# Patient Record
Sex: Female | Born: 1945
Health system: Southern US, Community
[De-identification: ages and names within clinical notes are randomized; demographics above are authoritative.]

## PROBLEM LIST (undated history)

## (undated) DIAGNOSIS — H269 Unspecified cataract: Secondary | ICD-10-CM

## (undated) DIAGNOSIS — I4891 Unspecified atrial fibrillation: Secondary | ICD-10-CM

## (undated) DIAGNOSIS — I10 Essential (primary) hypertension: Secondary | ICD-10-CM

## (undated) DIAGNOSIS — C50919 Malignant neoplasm of unspecified site of unspecified female breast: Secondary | ICD-10-CM

## (undated) DIAGNOSIS — E079 Disorder of thyroid, unspecified: Secondary | ICD-10-CM

## (undated) DIAGNOSIS — E039 Hypothyroidism, unspecified: Secondary | ICD-10-CM

## (undated) DIAGNOSIS — I872 Venous insufficiency (chronic) (peripheral): Secondary | ICD-10-CM

## (undated) HISTORY — DX: Unspecified atrial fibrillation: I48.91

## (undated) HISTORY — DX: Unspecified cataract: H26.9

## (undated) HISTORY — DX: Venous insufficiency (chronic) (peripheral): I87.2

## (undated) HISTORY — DX: Hypothyroidism, unspecified: E03.9

## (undated) HISTORY — PX: TUBAL LIGATION: SHX77

## (undated) HISTORY — DX: Disorder of thyroid, unspecified: E07.9

## (undated) HISTORY — DX: Malignant neoplasm of unspecified site of unspecified female breast: C50.919

## (undated) HISTORY — DX: Essential (primary) hypertension: I10

## (undated) SURGERY — EXCISION LIPOMA
Anesthesia: General | Laterality: Left

---
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
## 1983-06-12 HISTORY — PX: RECONSTRUCTION OF NOSE: SHX2301

## 1998-02-11 ENCOUNTER — Other Ambulatory Visit: Admission: RE | Admit: 1998-02-11 | Discharge: 1998-02-11 | Payer: Self-pay | Admitting: Obstetrics and Gynecology

## 1998-08-10 ENCOUNTER — Other Ambulatory Visit: Admission: RE | Admit: 1998-08-10 | Discharge: 1998-08-10 | Payer: Self-pay | Admitting: Obstetrics and Gynecology

## 1998-08-11 ENCOUNTER — Other Ambulatory Visit: Admission: RE | Admit: 1998-08-11 | Discharge: 1998-08-11 | Payer: Self-pay | Admitting: Obstetrics and Gynecology

## 1999-05-25 ENCOUNTER — Other Ambulatory Visit: Admission: RE | Admit: 1999-05-25 | Discharge: 1999-05-25 | Payer: Self-pay | Admitting: Obstetrics and Gynecology

## 1999-05-25 ENCOUNTER — Encounter (INDEPENDENT_AMBULATORY_CARE_PROVIDER_SITE_OTHER): Payer: Self-pay

## 1999-08-23 ENCOUNTER — Other Ambulatory Visit: Admission: RE | Admit: 1999-08-23 | Discharge: 1999-08-23 | Payer: Self-pay | Admitting: Obstetrics and Gynecology

## 2000-08-26 ENCOUNTER — Other Ambulatory Visit: Admission: RE | Admit: 2000-08-26 | Discharge: 2000-08-26 | Payer: Self-pay | Admitting: Obstetrics and Gynecology

## 2001-06-11 DIAGNOSIS — C50919 Malignant neoplasm of unspecified site of unspecified female breast: Secondary | ICD-10-CM

## 2001-06-11 HISTORY — DX: Malignant neoplasm of unspecified site of unspecified female breast: C50.919

## 2001-08-27 ENCOUNTER — Other Ambulatory Visit: Admission: RE | Admit: 2001-08-27 | Discharge: 2001-08-27 | Payer: Self-pay | Admitting: Obstetrics and Gynecology

## 2002-05-18 ENCOUNTER — Other Ambulatory Visit: Admission: RE | Admit: 2002-05-18 | Discharge: 2002-05-18 | Payer: Self-pay | Admitting: Radiology

## 2002-05-27 ENCOUNTER — Encounter: Payer: Self-pay | Admitting: Surgery

## 2002-05-27 ENCOUNTER — Encounter: Admission: RE | Admit: 2002-05-27 | Discharge: 2002-05-27 | Payer: Self-pay | Admitting: Surgery

## 2002-05-28 ENCOUNTER — Encounter (INDEPENDENT_AMBULATORY_CARE_PROVIDER_SITE_OTHER): Payer: Self-pay | Admitting: *Deleted

## 2002-05-28 ENCOUNTER — Encounter: Payer: Self-pay | Admitting: Surgery

## 2002-05-28 ENCOUNTER — Ambulatory Visit (HOSPITAL_BASED_OUTPATIENT_CLINIC_OR_DEPARTMENT_OTHER): Admission: RE | Admit: 2002-05-28 | Discharge: 2002-05-28 | Payer: Self-pay | Admitting: Surgery

## 2002-06-02 ENCOUNTER — Ambulatory Visit: Admission: RE | Admit: 2002-06-02 | Discharge: 2002-06-22 | Payer: Self-pay | Admitting: Radiation Oncology

## 2002-06-11 HISTORY — PX: OTHER SURGICAL HISTORY: SHX169

## 2002-09-08 ENCOUNTER — Other Ambulatory Visit: Admission: RE | Admit: 2002-09-08 | Discharge: 2002-09-08 | Payer: Self-pay | Admitting: Obstetrics and Gynecology

## 2003-09-13 ENCOUNTER — Other Ambulatory Visit: Admission: RE | Admit: 2003-09-13 | Discharge: 2003-09-13 | Payer: Self-pay | Admitting: Obstetrics and Gynecology

## 2004-04-11 ENCOUNTER — Ambulatory Visit: Payer: Self-pay

## 2004-04-17 ENCOUNTER — Ambulatory Visit: Payer: Self-pay | Admitting: Oncology

## 2004-06-21 ENCOUNTER — Ambulatory Visit: Payer: Self-pay | Admitting: Internal Medicine

## 2004-10-13 ENCOUNTER — Ambulatory Visit: Payer: Self-pay

## 2004-10-16 ENCOUNTER — Ambulatory Visit: Payer: Self-pay | Admitting: Oncology

## 2005-03-21 ENCOUNTER — Other Ambulatory Visit: Admission: RE | Admit: 2005-03-21 | Discharge: 2005-03-21 | Payer: Self-pay | Admitting: Obstetrics and Gynecology

## 2005-04-23 ENCOUNTER — Ambulatory Visit: Payer: Self-pay

## 2005-04-24 ENCOUNTER — Ambulatory Visit: Payer: Self-pay | Admitting: Oncology

## 2005-10-25 ENCOUNTER — Ambulatory Visit: Payer: Self-pay | Admitting: Oncology

## 2005-10-25 ENCOUNTER — Ambulatory Visit: Payer: Self-pay

## 2006-01-04 ENCOUNTER — Ambulatory Visit: Payer: Self-pay | Admitting: Internal Medicine

## 2006-03-26 ENCOUNTER — Other Ambulatory Visit: Admission: RE | Admit: 2006-03-26 | Discharge: 2006-03-26 | Payer: Self-pay | Admitting: Obstetrics and Gynecology

## 2006-04-25 ENCOUNTER — Ambulatory Visit: Payer: Self-pay

## 2006-04-25 ENCOUNTER — Ambulatory Visit: Payer: Self-pay | Admitting: Oncology

## 2006-10-25 ENCOUNTER — Ambulatory Visit: Payer: Self-pay

## 2006-10-29 ENCOUNTER — Ambulatory Visit: Payer: Self-pay | Admitting: Oncology

## 2007-10-16 ENCOUNTER — Ambulatory Visit: Payer: Self-pay

## 2007-10-28 ENCOUNTER — Ambulatory Visit: Payer: Self-pay | Admitting: Oncology

## 2007-11-06 ENCOUNTER — Inpatient Hospital Stay (HOSPITAL_COMMUNITY): Admission: AD | Admit: 2007-11-06 | Discharge: 2007-11-07 | Payer: Self-pay | Admitting: Cardiology

## 2007-11-06 ENCOUNTER — Ambulatory Visit: Payer: Self-pay | Admitting: Cardiology

## 2007-11-07 ENCOUNTER — Encounter: Payer: Self-pay | Admitting: Cardiology

## 2007-11-18 ENCOUNTER — Ambulatory Visit: Payer: Self-pay | Admitting: Cardiology

## 2008-01-06 ENCOUNTER — Ambulatory Visit: Payer: Self-pay | Admitting: Cardiology

## 2008-03-13 IMAGING — CR RIGHT FOOT COMPLETE - 3+ VIEW
1 series · 3 of 3 positions shown · non-contrast
Comparison: none

REASON FOR EXAM: pain rt foot   call report   [phone]   [phone]
COMMENTS:

PROCEDURE:     DXR - DXR FOOT RT COMPLETE W/OBLIQUES  - January 04, 2006  [DATE]
RESULT:          Three views reveals no fractures, no dislocations.  The
joint spaces are intact.

[Series 1: view not recorded · 0.17mm/px · 3 of 3 slices shown]
[im 1/3]
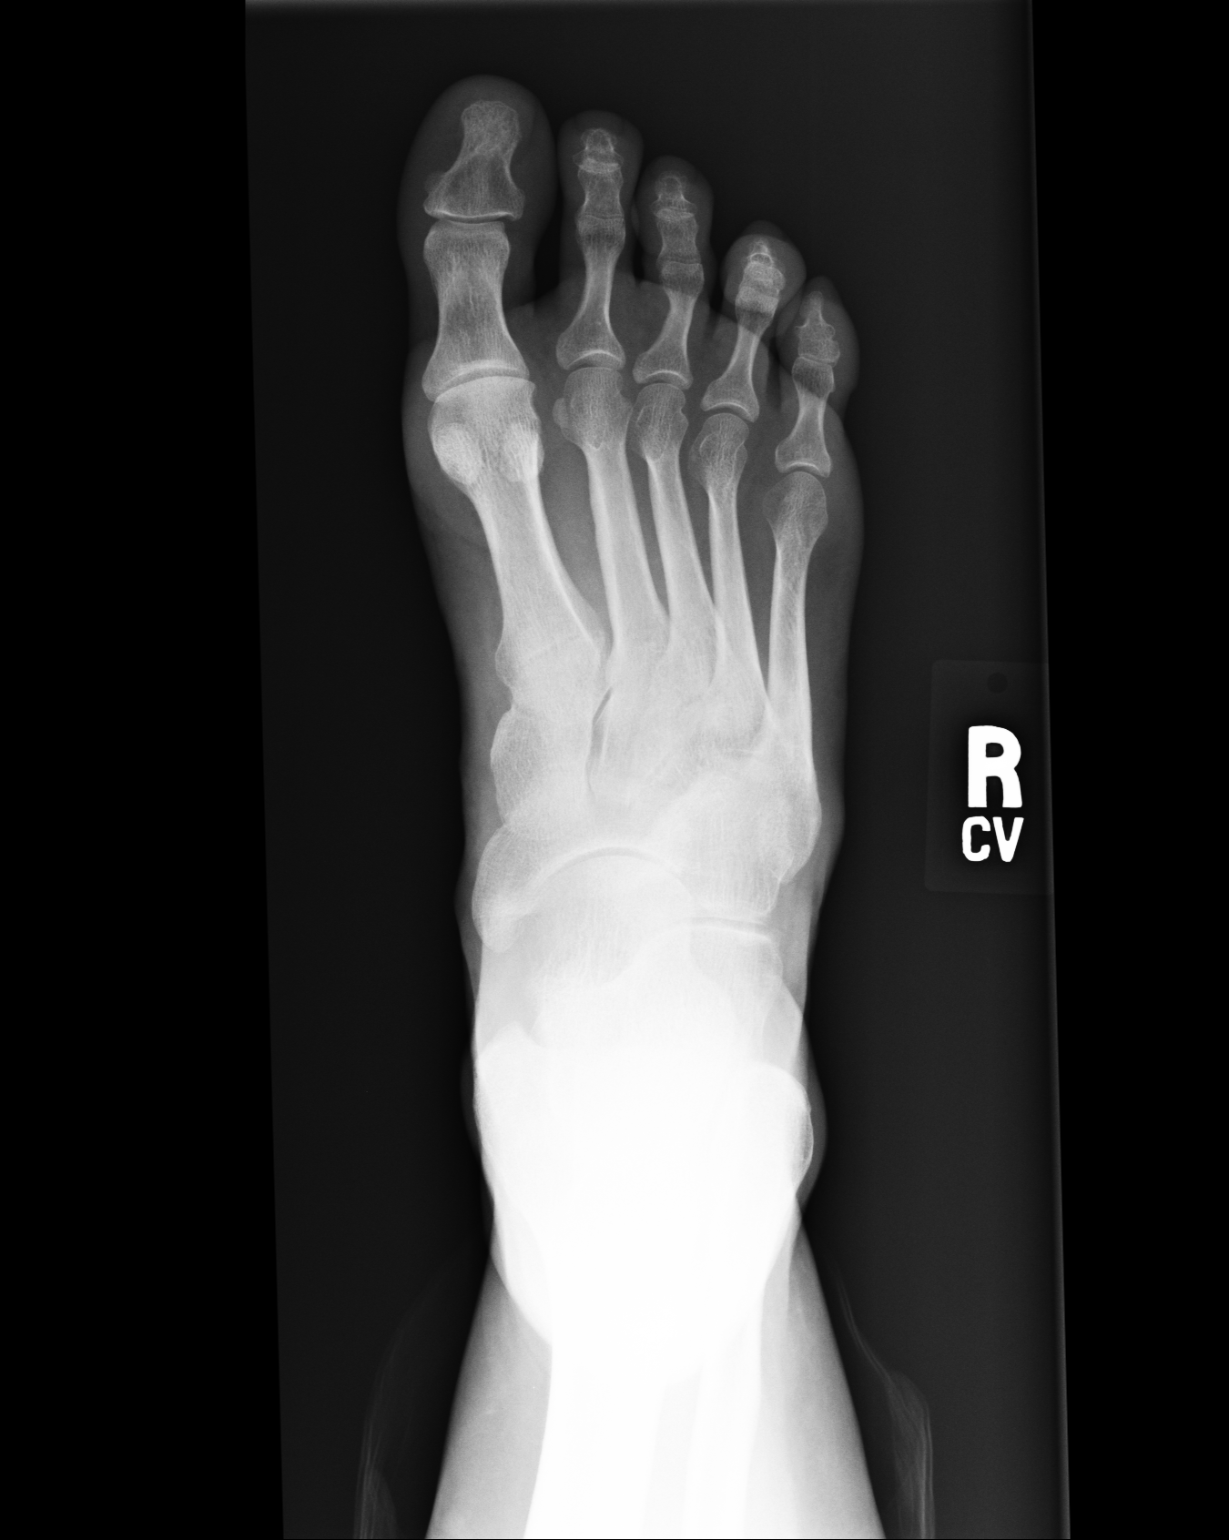
[im 2/3]
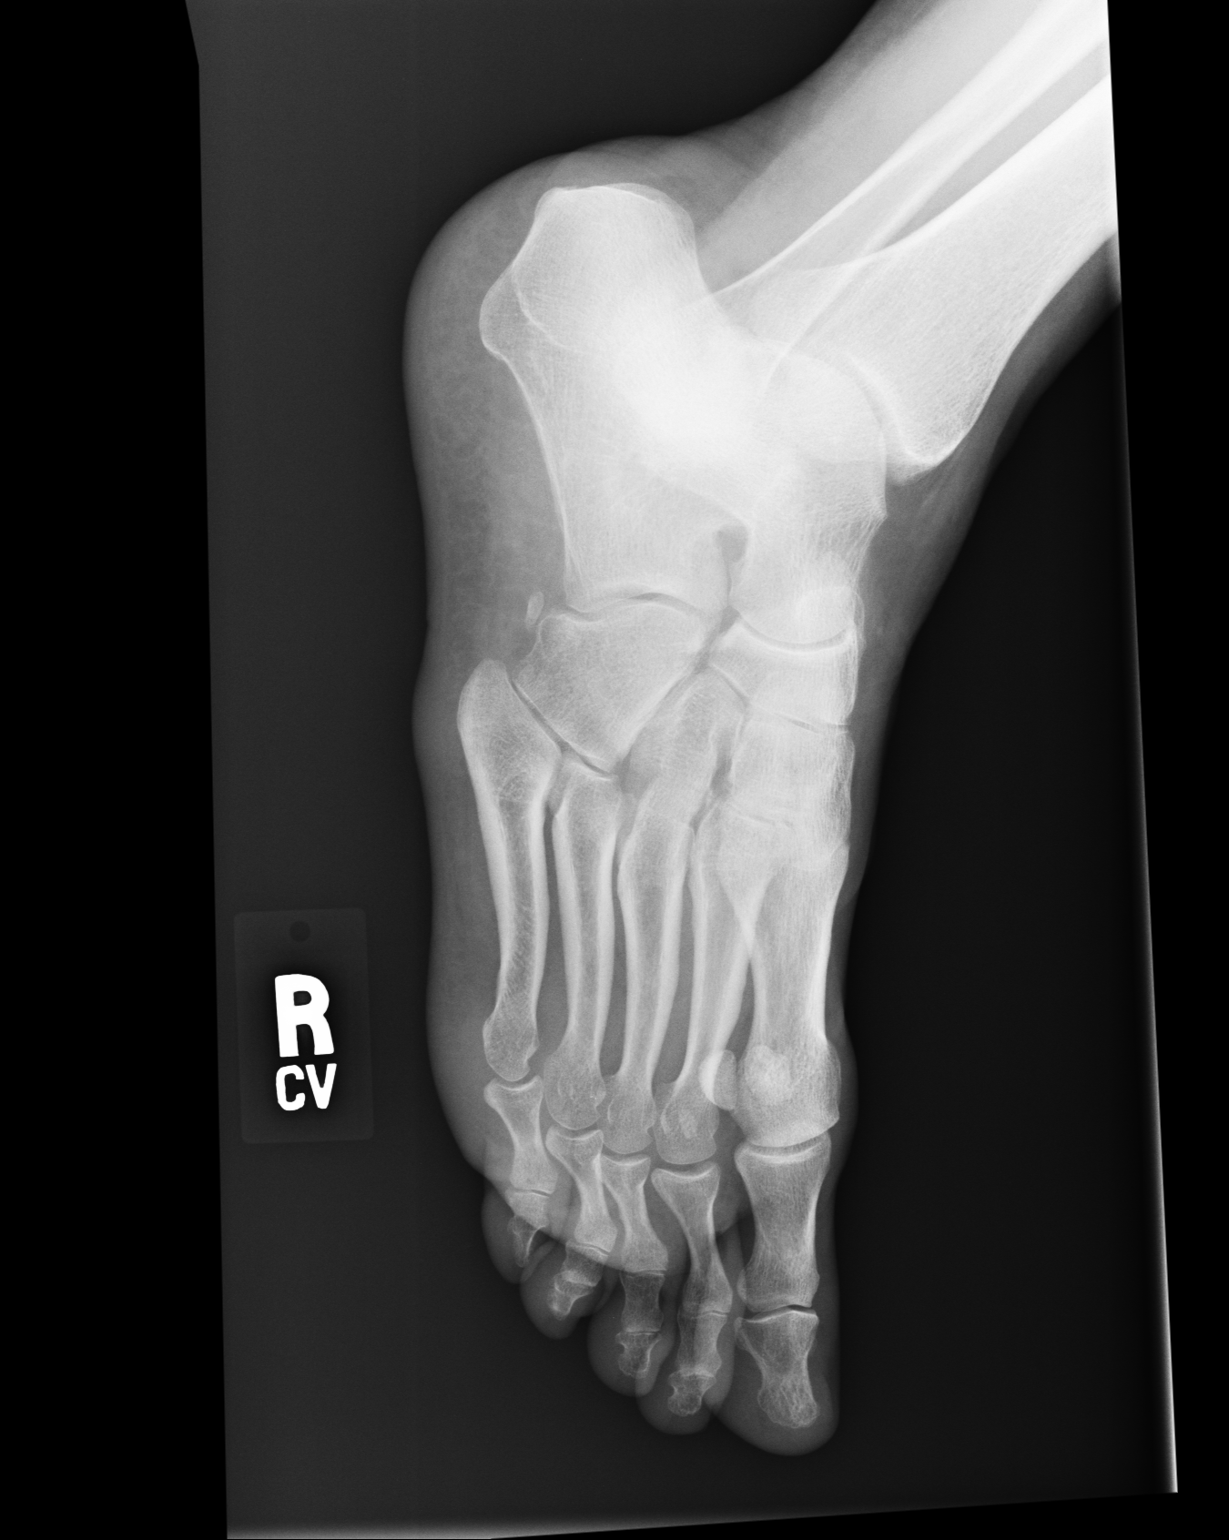
[im 3/3]
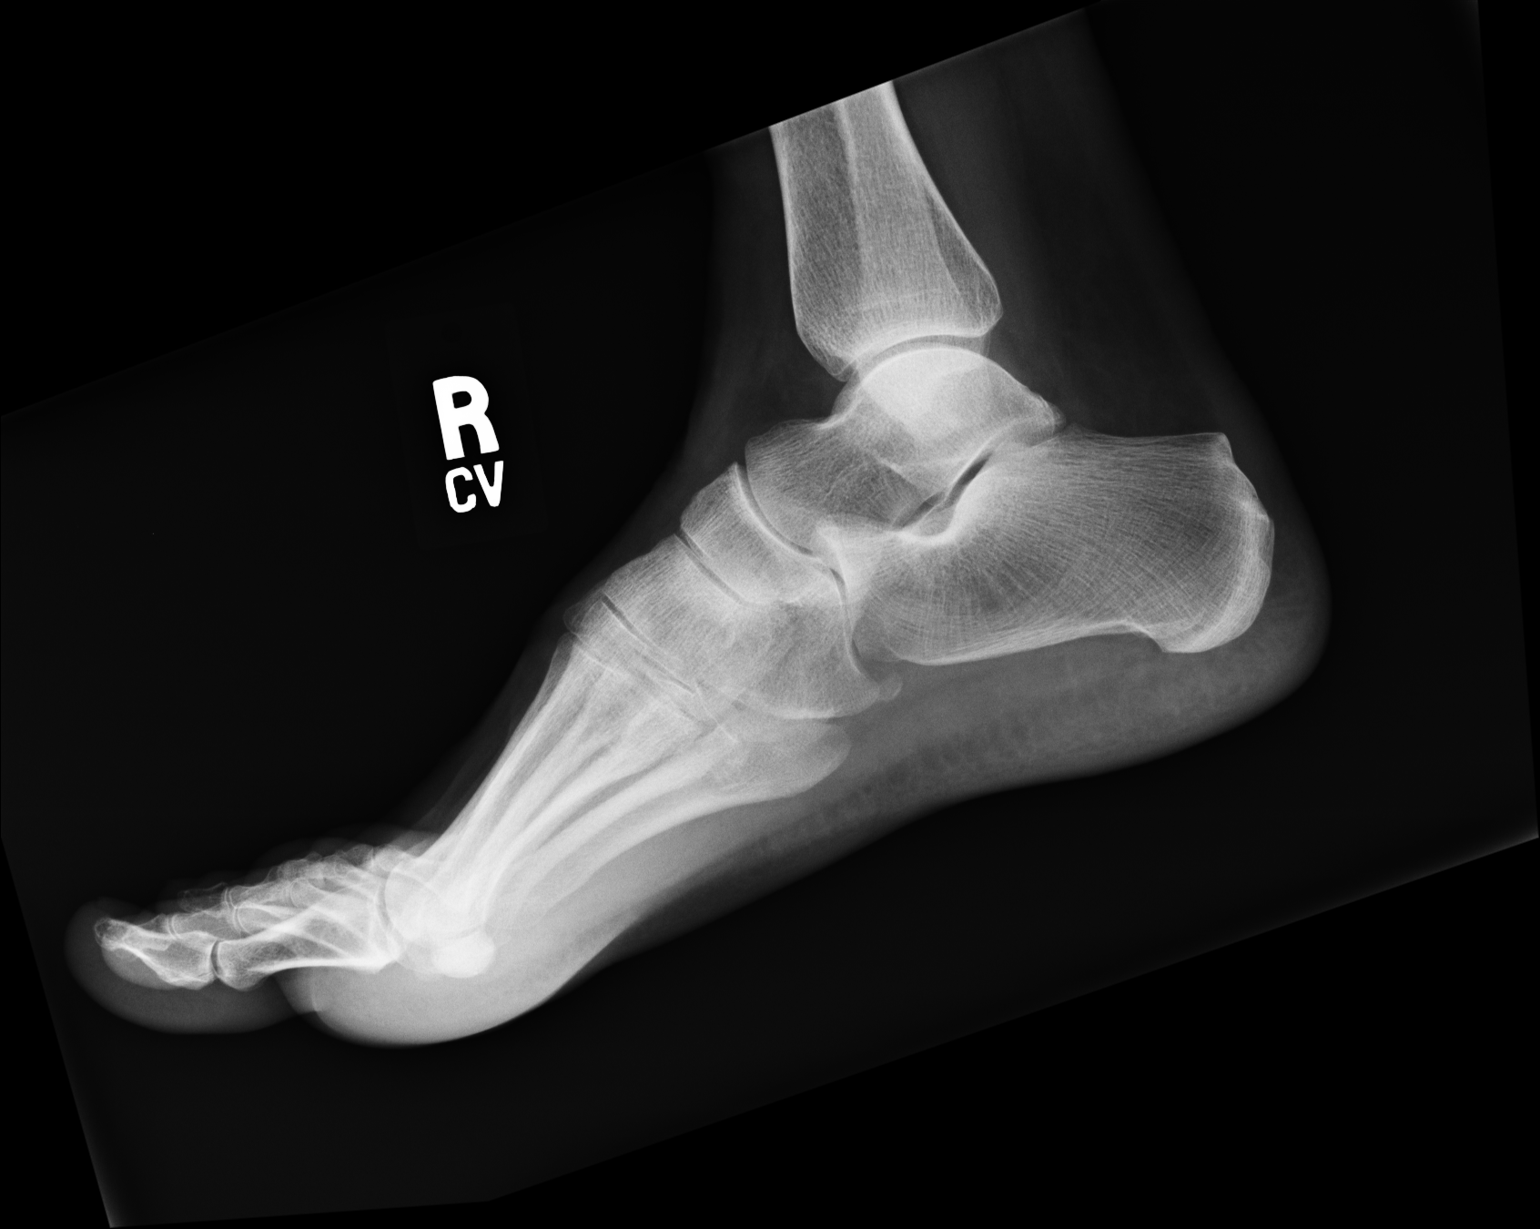

[3 of 3 positions shown; findings below may reference images not displayed]

IMPRESSION: No acute fracture is identified of the RIGHT foot.

## 2008-05-13 ENCOUNTER — Ambulatory Visit: Payer: Self-pay | Admitting: Cardiology

## 2008-05-27 ENCOUNTER — Encounter: Payer: Self-pay | Admitting: Cardiology

## 2008-05-27 ENCOUNTER — Ambulatory Visit: Payer: Self-pay

## 2008-05-27 ENCOUNTER — Ambulatory Visit: Payer: Self-pay | Admitting: Cardiology

## 2008-07-01 ENCOUNTER — Ambulatory Visit: Payer: Self-pay | Admitting: Cardiology

## 2008-08-09 ENCOUNTER — Ambulatory Visit (HOSPITAL_COMMUNITY): Admission: RE | Admit: 2008-08-09 | Discharge: 2008-08-09 | Payer: Self-pay | Admitting: Obstetrics and Gynecology

## 2008-08-09 ENCOUNTER — Encounter (INDEPENDENT_AMBULATORY_CARE_PROVIDER_SITE_OTHER): Payer: Self-pay | Admitting: Obstetrics and Gynecology

## 2008-08-20 ENCOUNTER — Emergency Department (HOSPITAL_COMMUNITY): Admission: EM | Admit: 2008-08-20 | Discharge: 2008-08-20 | Payer: Self-pay | Admitting: Emergency Medicine

## 2008-10-29 ENCOUNTER — Ambulatory Visit: Payer: Self-pay | Admitting: Oncology

## 2008-11-02 ENCOUNTER — Encounter: Payer: Self-pay | Admitting: Cardiology

## 2009-01-05 ENCOUNTER — Ambulatory Visit: Payer: Self-pay | Admitting: Gastroenterology

## 2009-02-11 IMAGING — US US-BREAST([ID])
1 series · 13 of 15 positions shown · non-contrast
Comparison: NONE

CLINICAL DATA: Prior right breast cancer and lumpectomy. 

BILATERAL BREAST ULTRASOUND

[Series 1: us breast · 0.06mm/px · 13 of 15 slices shown]
[im 1/15]
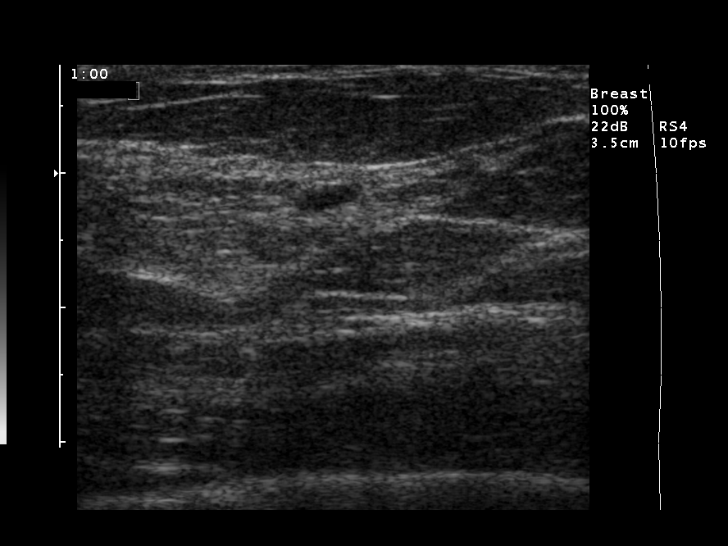
[im 2/15]
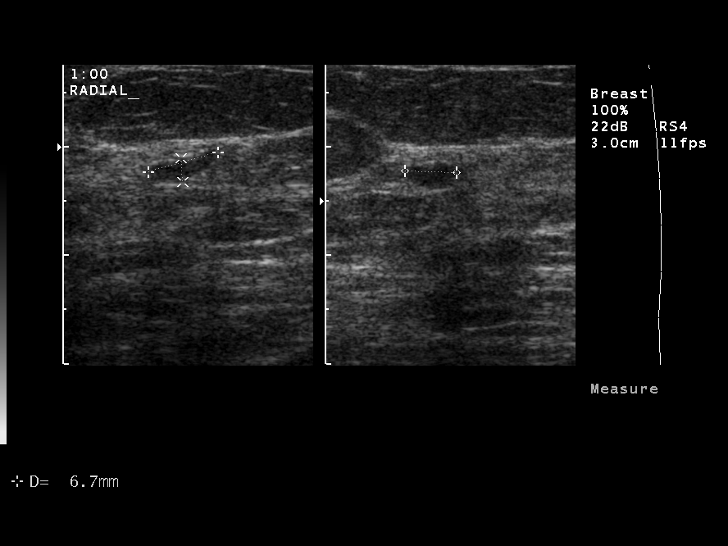
[im 3/15]
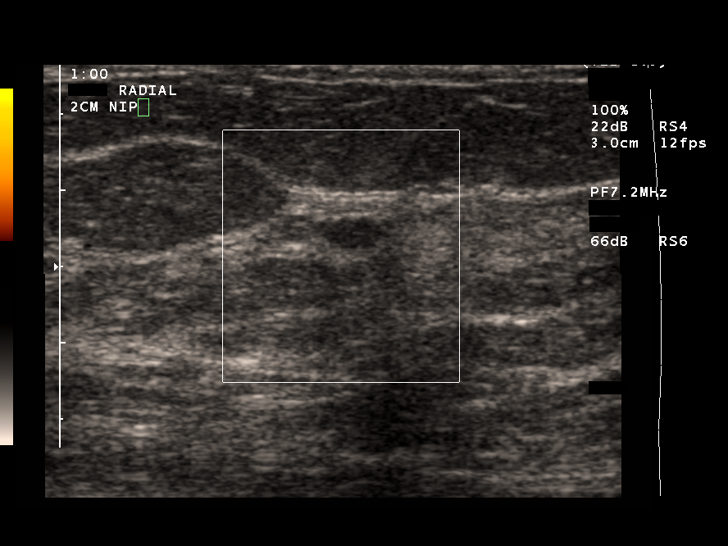
[im 5/15]
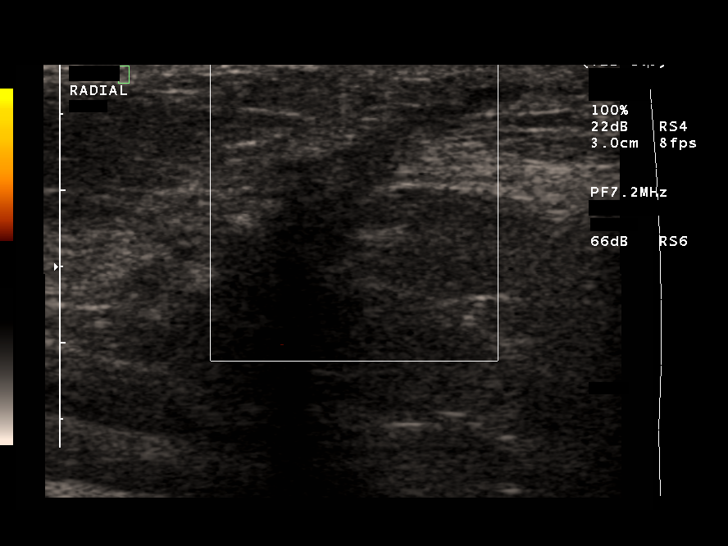
[im 6/15]
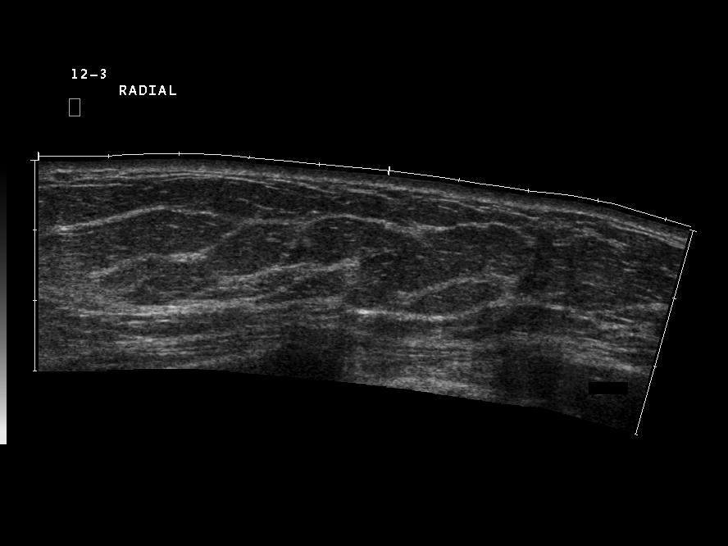
[im 7/15]
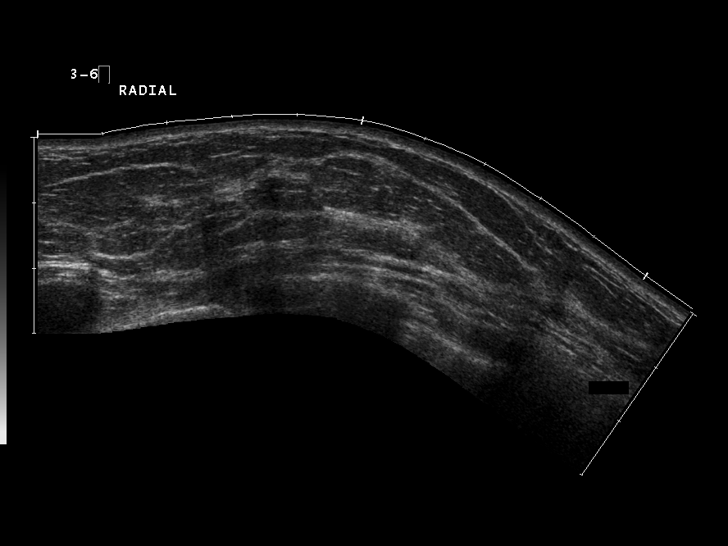
[im 8/15]
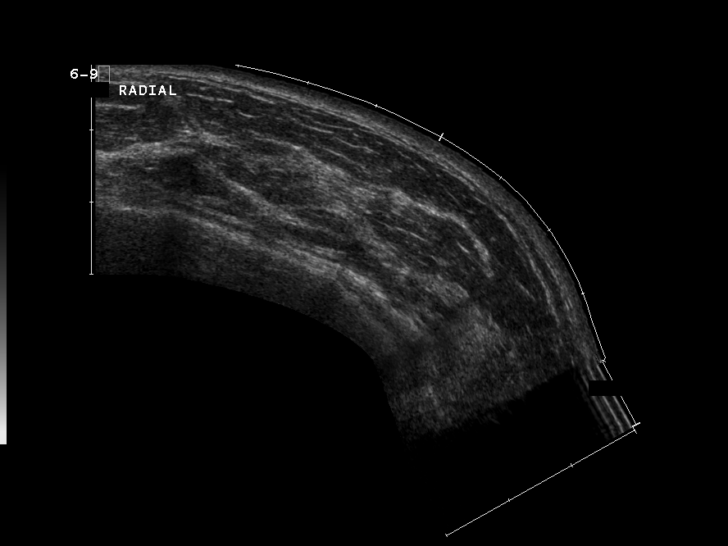
[im 9/15]
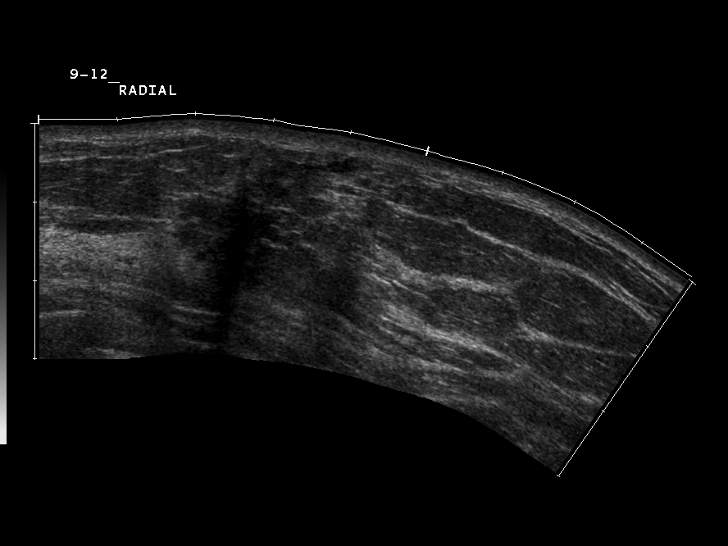
[im 10/15]
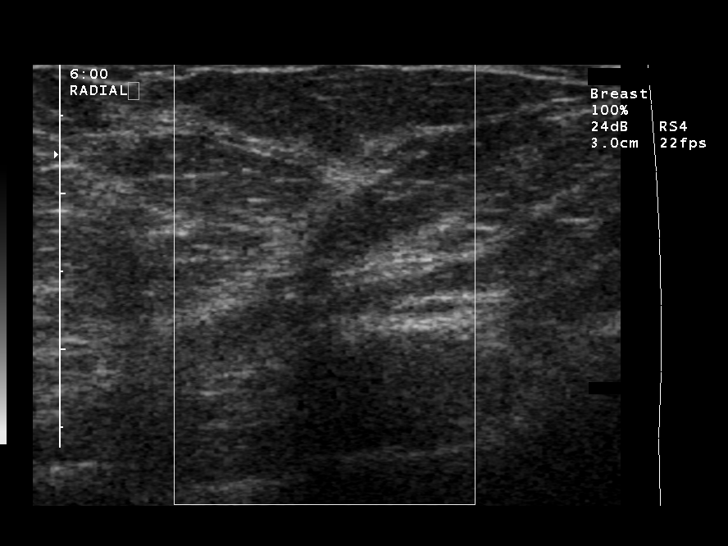
[im 11/15]
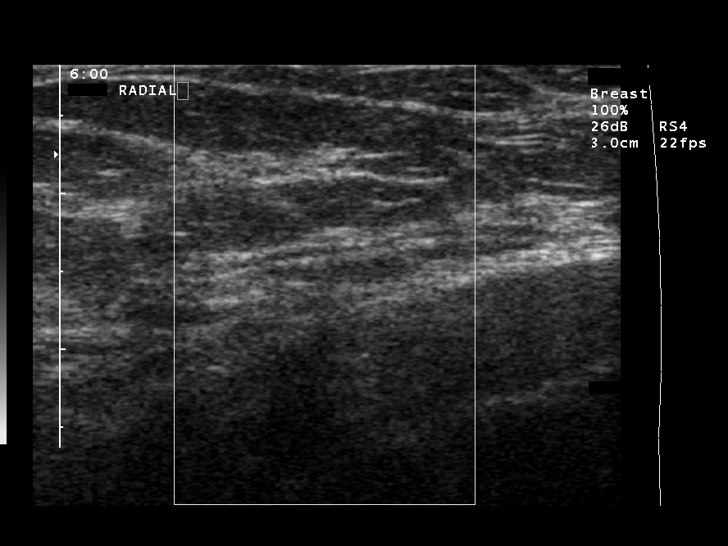
[im 13/15]
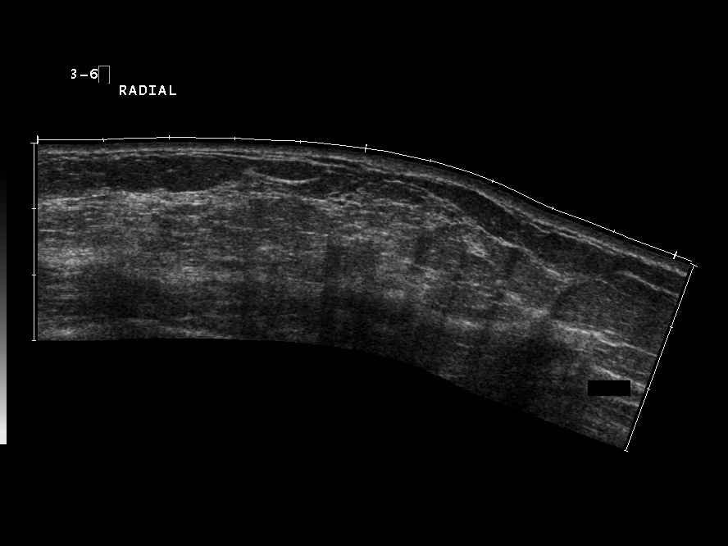
[im 14/15]
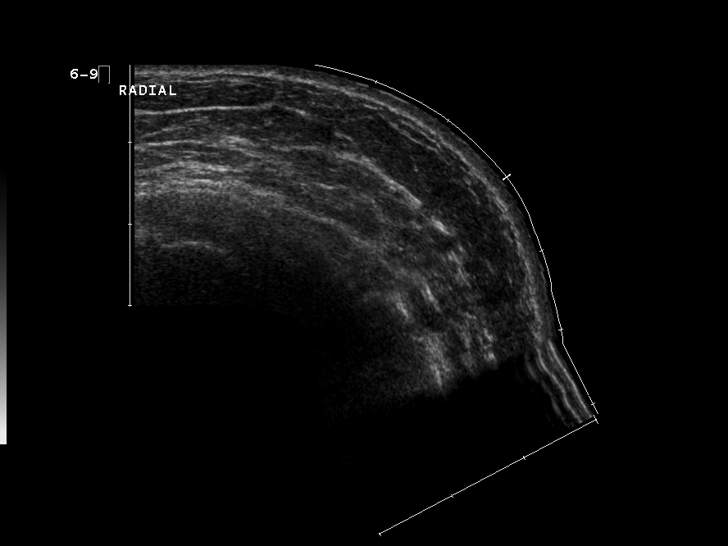
[im 15/15]
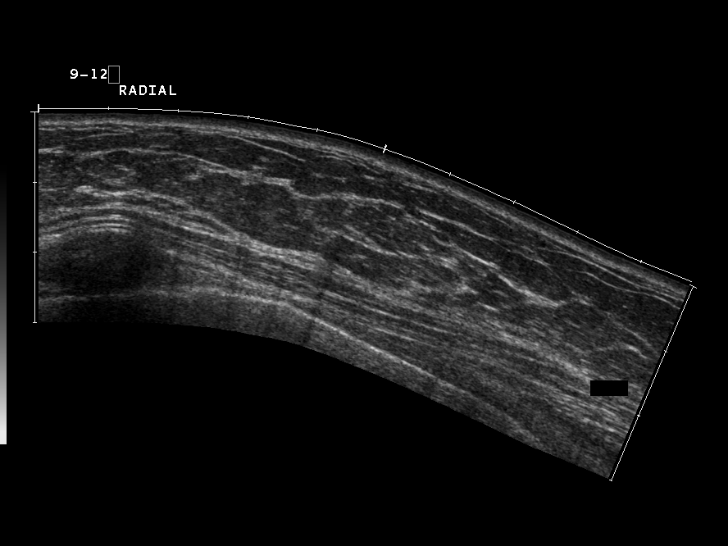

[13 of 15 positions shown; findings below may reference images not displayed]

FINDINGS: Ultrasound was performed of both breasts. The patient 
has a history of previous right breast cancer and lumpectomy. 
There again is acoustic shadowing at the scar site in the right 
breast at the 11 o???clock position. This does not appear to be 
significantly changed compared to the prior ultrasounds of 
01/18/2004 and 12/01/2004. There is a small, well-circumscribed, 
oval-shaped hypoechoic lesion seen at the 1 o???clock position of 
the right breast that measures 7 x 2 x 5 mm. This is not 
significantly changed compared to the ultrasound of 2777. 
Ultrasound of the left breast did not demonstrate the previously 
seen hypoechoic lesion.
IMPRESSION: Acoustic shadowing is noted at the lumpectomy site of 
the right breast which does not appear to be significantly changed 
and likely is due to scar tissue. There is also an unchanged, 
well-circumscribed hypoechoic lesion in the right breast. No 
ultrasonographic evidence of malignancy is seen involving either 
breast. Advised the patient to have a follow-up yearly mammogram 
and informed the patient that mammography compliments the 
ultrasound study. The patient prefers to have yearly follow-up 
ultrasound of both breasts. BI-RADS 2- Benign Vr, M[place] Date: 12/06/2006 DAS  [REDACTED]

## 2009-06-11 DIAGNOSIS — I4891 Unspecified atrial fibrillation: Secondary | ICD-10-CM

## 2009-06-11 HISTORY — DX: Unspecified atrial fibrillation: I48.91

## 2009-11-02 ENCOUNTER — Ambulatory Visit: Payer: Self-pay | Admitting: Oncology

## 2009-12-09 ENCOUNTER — Encounter: Payer: Self-pay | Admitting: Internal Medicine

## 2010-07-18 ENCOUNTER — Encounter: Payer: Self-pay | Admitting: Internal Medicine

## 2010-07-18 ENCOUNTER — Ambulatory Visit (INDEPENDENT_AMBULATORY_CARE_PROVIDER_SITE_OTHER): Payer: BC Managed Care – PPO | Admitting: Internal Medicine

## 2010-07-18 DIAGNOSIS — R030 Elevated blood-pressure reading, without diagnosis of hypertension: Secondary | ICD-10-CM | POA: Insufficient documentation

## 2010-07-18 DIAGNOSIS — I4891 Unspecified atrial fibrillation: Secondary | ICD-10-CM

## 2010-07-18 DIAGNOSIS — E0590 Thyrotoxicosis, unspecified without thyrotoxic crisis or storm: Secondary | ICD-10-CM | POA: Insufficient documentation

## 2010-07-21 ENCOUNTER — Encounter: Payer: Self-pay | Admitting: Internal Medicine

## 2010-07-27 NOTE — Assessment & Plan Note (Signed)
Summary: est. patient/pressure problems/sab  Medications Added * PROGESTERONE unknown dosage * ARMOR THYROID unknown dosage DILTIAZEM HCL ER BEADS 240 MG XR24H-CAP (DILTIAZEM HCL ER BEADS) 1 tablet oncedaily      Allergies Added: ! SULFA ! PENICILLIN  Visit Type:  Initial Consult Primary Provider:  Rueben Bash  CC:  establish care. c/o heart irregularity. Denies chest pain and SOB. Marland Kitchen  History of Present Illness: [name] is a lady who pre.sents for further evaluation of irregular heartbeat.  her cardiac history dates back a couple of years when she was found to be in atrial fibrillation. She apparently converted spontaneously. An echo cardiogram done at that time by Dr. Jens Som demonstrated normal left ventricular function and normal left atrial size.  It was the patient's impression that this was caused by a pharmacist error related to changing her "natural hormones and thyroid to "pharmacological". She ended up being hyperthyroid as she recalls. She was also found at that time to be hypertensive. Over the ensuing couple of years she has stopped taking the diltiazem. Her blood pressure has normalized and she brings her in greater for a year and a half of blood pressures that are mostly normal.  She also underwent "abdominal massage" to deal with what she thought was a hiatal hernia responsible for her palpitations While taking her blood pressure she found about 3 months ago that her heart rhythm was irregular. It has been intermittently identified as irregular. The patient has scant symptoms that she can attribute to the irregularity in terms of exercise performance, lightheadedness or fatigue. Only rarely with extreme exercise i.e. walking up hills that she never noticed any shortness of breath at all.  Thromboembolic risk factors are notable for gender and borderline age.  Notably however her last blood work which we reviewed today from July has a significantly suppressed TSH  suggesting that she may be hyperthyroid. This low TSH level apparently did not result in a change in her thyroid replacement dosing.  the patient is very attuned to her body and uses mostly only natural supplements of which she takes a great many  Preventive Screening-Counseling & Management  Alcohol-Tobacco     Smoking Status: never  Caffeine-Diet-Exercise     Does Patient Exercise: yes      Drug Use:  no.    Current Medications (verified): 1)  Progesterone .... Unknown Dosage 2)  Armor Thyroid .... Unknown Dosage 3)  Diltiazem Hcl Er Beads 240 Mg Xr24h-Cap (Diltiazem Hcl Er Beads) .Marland Kitchen.. 1 Tablet Oncedaily  Allergies (verified): 1)  ! Sulfa 2)  ! Penicillin  Past History:  Family History: Last updated: 07-27-2010 Father: MI- deceased Mother: Stroke-deceased  Social History: Last updated: 2010/07/27 Full Time Single  Tobacco Use - No.  Alcohol Use - yes-1-2 glasses daily Regular Exercise - yes Drug Use - no  Risk Factors: Exercise: yes (2010-07-27)  Risk Factors: Smoking Status: never (07/27/10)  Past Medical History: Hypertension breast cancer status post lumpectomy and radiation therapy Thyroid problems hiatal hernia Atrial fibrillation-2011 Irregular heart rate-2011  Past Surgical History: Breast tumor-2004 Tonsillectomy-1953 Nose reconstruction-1985 tubal ligation  Family History: Father: MI- deceased Mother: Stroke-deceased  Social History: Full Time Single  Tobacco Use - No.  Alcohol Use - yes-1-2 glasses daily Regular Exercise - yes Drug Use - no Smoking Status:  never Does Patient Exercise:  yes Drug Use:  no  Review of Systems       full review of systems was negative apart from a history of present  illness and past medical history tinnitus and fatigue   Vital Signs:  Patient profile:   65 year old female Height:      69 inches Weight:      155.50 pounds BMI:     23.05 Pulse rate:   105 / minute BP sitting:   150 / 95   (left arm) Cuff size:   regular  Vitals Entered By: Lysbeth Galas CMA (July 18, 2010 10:45 AM)  Physical Exam  General:  Well developed, well nourished old Caucasian female appearing her stated age, in no acute distress. Head:  normal HEENT Neck:  supple without thyromegaly neck veins are 6 cm carotid brisk and full without bruits Chest Wall:  without kyphosis Lungs:  clear to auscultation Heart:  irregular rate and rhythm without significant murmurs or gallops PMI was not displaced Abdomen:  soft with active bowel sounds with a primary midline pulsation of the patient was quite thin no hepatomegaly Msk:  without obvious skeletal defects Pulses:  intact distal pulses Extremities:  without clubbing cyanosis or edema Neurologic:  alert and oriented and grossly normal motor and sensory function Skin:  warm and dry Cervical Nodes:  without adenopathy Psych:  engaging affect   EKG  Procedure date:  07/18/2010  Findings:      atrial fibrillation at 105inferolateral ST-T changes  Impression & Recommendations:  Problem # 1:  ATRIAL FIBRILLATION (ICD-427.31) we had a lengthy discussion regarding the physiology and the concerns of atrial fibrillation specifically related to thromboembolism. We discussed her CHADS VASC score as well as the potential implications of hyperthyroidism associated with increased risk of thromboembolism. Her heart rate is somewhat fast today. Her last TSH suggests that she is hyperthyroid. She will get these labs reviewed with the goal of becoming euthyroid. At that time we will undertake that recording to try to understand with her atrial fibrillation is paroxysmal versus persistent. In the event that is the latter, we would undertake cardioversion associated with short-term anticoagulation. We discussed at great length today the side effects and the options to Coumadin including Pradaxa andXeralto.  For right now we will hold off on anticoagulation.Based on the  recent [place] study I am not sure that aspirin is appropriate as an alternative  following the establishment of euthyroidism, will also undertake an echo. Her last echo 2 years ago was normal with normal left atrial size.  We'll also at that point need to assure adequate rate control.  Problem # 2:  HYPERTHYROIDISM (ICD-242.90) as above  Problem # 3:  INCREASED BLOOD PRESSURE (ICD-796.2) her home blood pressures are not consistent with a diagnosis of hypertension and so her CHADS VASC score currently remains one  Other Orders: EKG w/ Interpretation (93000)

## 2010-07-27 NOTE — Letter (Addendum)
Summary: At Home BP Readings  At Home BP Readings   Imported By: Harlon Flor 07/21/2010 16:20:33  _____________________________________________________________________  External Attachment:    Type:   Image     Comment:   External Document  Appended Document: At Home BP Readings Preliminarily reviewed. Forwarded to MD desktop for review and signature /MES

## 2010-08-24 ENCOUNTER — Other Ambulatory Visit: Payer: Self-pay | Admitting: Nurse Practitioner

## 2010-08-24 DIAGNOSIS — E039 Hypothyroidism, unspecified: Secondary | ICD-10-CM

## 2010-08-24 DIAGNOSIS — E063 Autoimmune thyroiditis: Secondary | ICD-10-CM

## 2010-08-31 ENCOUNTER — Other Ambulatory Visit: Payer: BC Managed Care – PPO

## 2010-09-01 ENCOUNTER — Other Ambulatory Visit: Payer: BC Managed Care – PPO

## 2010-09-21 LAB — URINALYSIS, ROUTINE W REFLEX MICROSCOPIC
Bilirubin Urine: NEGATIVE
Glucose, UA: NEGATIVE mg/dL
Ketones, ur: NEGATIVE mg/dL
Nitrite: NEGATIVE
Specific Gravity, Urine: 1.007 (ref 1.005–1.030)
pH: 7 (ref 5.0–8.0)

## 2010-09-21 LAB — CBC
MCHC: 35.1 g/dL (ref 30.0–36.0)
MCV: 95.6 fL (ref 78.0–100.0)
Platelets: 312 10*3/uL (ref 150–400)
RBC: 4.2 MIL/uL (ref 3.87–5.11)
RDW: 11.7 % (ref 11.5–15.5)

## 2010-09-21 LAB — BASIC METABOLIC PANEL
BUN: 8 mg/dL (ref 6–23)
CO2: 27 mEq/L (ref 19–32)
Calcium: 9.4 mg/dL (ref 8.4–10.5)
Chloride: 106 mEq/L (ref 96–112)
Creatinine, Ser: 0.6 mg/dL (ref 0.4–1.2)
GFR calc Af Amer: >60 mL/min (ref >60)

## 2010-09-21 LAB — RAPID URINE DRUG SCREEN, HOSP PERFORMED
Benzodiazepines: NOT DETECTED
Cocaine: NOT DETECTED
Opiates: NOT DETECTED

## 2010-09-21 LAB — PROTIME-INR
INR: 1 (ref 0.00–1.49)
Prothrombin Time: 13.3 seconds (ref 11.6–15.2)

## 2010-09-21 LAB — POCT CARDIAC MARKERS: Myoglobin, poc: 50.3 ng/mL (ref 12–200)

## 2010-09-21 LAB — DIFFERENTIAL
Basophils Absolute: 0 10*3/uL (ref 0.0–0.1)
Basophils Relative: 1 % (ref 0–1)
Eosinophils Absolute: 0.1 10*3/uL (ref 0.0–0.7)
Neutro Abs: 5 10*3/uL (ref 1.7–7.7)
Neutrophils Relative %: 65 % (ref 43–77)

## 2010-09-21 LAB — APTT: aPTT: 27 seconds (ref 24–37)

## 2010-09-26 LAB — BASIC METABOLIC PANEL
CO2: 29 mEq/L (ref 19–32)
Calcium: 9.3 mg/dL (ref 8.4–10.5)
Chloride: 105 mEq/L (ref 96–112)
GFR calc Af Amer: >60 mL/min (ref >60)
Glucose, Bld: 106 mg/dL — ABNORMAL HIGH (ref 70–99)
Potassium: 4.4 mEq/L (ref 3.5–5.1)
Sodium: 139 mEq/L (ref 135–145)

## 2010-09-26 LAB — CBC
HCT: 37.8 % (ref 36.0–46.0)
Hemoglobin: 12.9 g/dL (ref 12.0–15.0)
MCHC: 34 g/dL (ref 30.0–36.0)
RBC: 3.83 MIL/uL — ABNORMAL LOW (ref 3.87–5.11)
RDW: 11.9 % (ref 11.5–15.5)

## 2010-09-27 ENCOUNTER — Ambulatory Visit
Admission: RE | Admit: 2010-09-27 | Discharge: 2010-09-27 | Disposition: A | Discharge status: Home / Self Care | Payer: BC Managed Care – PPO | Source: Ambulatory Visit | Attending: Nurse Practitioner | Admitting: Nurse Practitioner

## 2010-09-27 DIAGNOSIS — E063 Autoimmune thyroiditis: Secondary | ICD-10-CM

## 2010-09-27 DIAGNOSIS — E039 Hypothyroidism, unspecified: Secondary | ICD-10-CM

## 2010-10-24 NOTE — Discharge Summary (Signed)
NAMEJILLIANNA, [name]              ACCOUNT NO.:  [id number]   MEDICAL RECORD NO.:  [id number]          PATIENT TYPE:  INP   LOCATION:  3736                         FACILITY:  [facility]   PHYSICIAN:  Madolyn Frieze. Jens Som, MD, FACCDATE OF BIRTH:  08-31-1945   DATE OF ADMISSION:  11/06/2007  DATE OF DISCHARGE:                         DISCHARGE SUMMARY - REFERRING   PRIMARY CARE PHYSICIAN:  It is not clear at the time of dictation who  her primary care physician is.   DISCHARGE DIAGNOSES:  1. Atrial fibrillation with a rapid ventricular rate, felt secondary      to thyroid medications.  2. History of hyperthyroidism.  3. History of breast cancer.   SUMMARY OF HISTORY:  [name] is a 65 year old female who presented  with dyspnea associated with extreme activities, now occurring more  frequently relieved with routine activities.  Her EKG showed atrial  fibrillation with a RVR, thus she was admitted.   PAST MEDICAL HISTORY:  Notable for  1. Hypothyroidism.  2. Breast cancer.   She has been treated with Armour Thyroid as well as Cytomel alternating.  In December 2008, her T3 was 9.5 and her TSH 0.010.  Her dose had not  been changed.  She was noted to be tachycardiac by Dr. Darnelle Catalan and her  medications were discontinued and she was referred for further  evaluation.   LABORATORY:  Chest x-ray on the 29th did not show any acute findings.   EKG on admission showed atrial fibrillation with a ventricular rate of  149, normal axis, nonspecific ST-T wave changes.   Admission weight was 65.7.  H&H 13.7 and 39.9, normal indices, platelets  264, WBCs 7.6.  Sodium 142, potassium 4.4, BUN 11, creatinine 0.63,  glucose 98.  Normal LFTs.   HOSPITAL COURSE:  The patient was admitted to [facility] by Dr. Jens Som.  Her IV Cardizem was changed to p.o. Cardizem.  Given her CHADS score of 0, it was felt that she did not need Coumadin  initially; however, given that her atrial  fibrillation is most likely  secondary to increased Cytomel and Armour Thyroid dosing, he has  recommended Coumadin until her atrial fibrillation resolves.  As her  medications wash out of her system, Dr. Jens Som feels that her atrial  fibrillation will terminate; however, the patient refuses Coumadin  therapy despite Dr. Jens Som explaining the risk of embolic event on  aspirin alone.  Dr. Jens Som would like an echocardiogram performed and  post echocardiogram she could be discharged this afternoon with follow-  up with him.  Dr. Jens Som does not feel it would be a long-term problem  and it will self-terminate. If it had not resolved by the time she  returns, he will discuss short-term Coumadin and cardioversion.  All  thyroid medications will be held at this time and she was asked to  follow up with her endocrinologist in [place], [place].   DISPOSITION:  The patient is discharged home post her echocardiogram,  the results of which are pending at the time of this dictation.  She is  asked to maintain low  salt, heart-healthy diet and increase her activity  slowly.  She received prescriptions for Cardizem CD 240 mg daily and  asked to begin coated aspirin 325 mg daily.  She was advised not to take  any of her thyroid medications and to bring all medications to all  appointments.  She was see Dr. Jens Som on the [place], [place], office on Tuesday November 18, 2007, at 10:15 a.m. and she was  asked to call her endocrinologist for a follow-up two-to-three week  appointment.  Once her echo is completed, if her echo does not show any  acute abnormality, she will be discharged home.      Joellyn Rued, PA-C      Madolyn Frieze Jens Som, MD, [facility]  Electronically Signed    EW/MEDQ  D:  11/07/2007  T:  11/07/2007  Job:  [id number]   cc:   Valentino Hue. Magrinat, M.D.

## 2010-10-24 NOTE — Assessment & Plan Note (Signed)
[facility] OFFICE NOTE   CARLISIA, GENO                     MRN:          [id number]  DATE:01/06/2008                            DOB:          1946/02/06    Ms. Yearby returns today for iatrogenic hyperthyroidism-induced atrial  fibrillation with a rapid ventricular response.   Please see the discharge summary from Nov 06, 2007 for details.  Also,  see Dr. Ludwig Clarks office note in followup, November 18, 2007.   She brings in a long list of blood pressure readings, which are showing  some lability in her pressures.  She is running sometimes as high as 160-  170 systolics.  Diastolics are generally favorable in the 70s and 80s.  Her heart rates have been in the 70s, and she has not had truly atrial  fib symptomatically.  Her blood pressure on average is still probably  pretty good, around 135 over about 80 or less.   She is extremely health conscious and has taken great care of herself  for years.  She is upset that she has now had all these problems and is  pointing blame at a pharmacist who put her on Cytomel without consulting  her physician.  She is now going back to have blood drawn to check on  her thyroid panel.  I do not have those results today.   On her exam today, her blood pressure is 160/68, her heart rate was 104  when she walked in, now it is running about 90 and regular.  EKG shows  sinus rhythm.  She has some nonspecific ST-segment changes.   Her exam is basically stable.   I have had a long talk with Ms. Tetterton today.  I have tried to answer  all of her questions honestly.  I have advised the following:  Check on  her thyroid blood work.  When she is euthyroid, she can come off the  Cardizem and aspirin as outlined by Dr. Jens Som in his last note.  She  will have to watch her blood pressure closely; however, since she and I  have both discussed the antihypertensive role of Cardizem.  We  would be  happy to see here for blood pressure management or of course further  atrial fibrillation.   I have asked her to try to average blood pressure for around 130-135  systolic over 80 or less diastolic.  In addition, she can take her  Cardizem 240 at bedtime rather than in the morning since her blood  pressures tend to be higher in the morning based on her values.   We will plan on seeing her back in 3 months or p.r.n.     Thomas C. Daleen Squibb, MD, [facility]  Electronically Signed    TCW/MedQ  DD: 01/06/2008  DT: 01/07/2008  Job #: [id number]   cc:   Elie Goody, PA-C  Archie Balboa, MD

## 2010-10-24 NOTE — Discharge Summary (Signed)
[name], [name]              ACCOUNT NO.:  [id number]   MEDICAL RECORD NO.:  [id number]          PATIENT TYPE:  INP   LOCATION:  3736                         FACILITY:  [facility]   PHYSICIAN:  Madolyn Frieze. Jens Som, MD, FACCDATE OF BIRTH:  [birth year]   DATE OF ADMISSION:  11/06/2007  DATE OF DISCHARGE:                         DISCHARGE SUMMARY - REFERRING   ADDENDUM:  Addendum to discharge summary, [phone].   DISCHARGE TIME:  Thirty-five minutes.      Joellyn Rued, PA-C      Madolyn Frieze Jens Som, MD, [facility]  Electronically Signed    EW/MEDQ  D:  11/07/2007  T:  11/07/2007  Job:  [id number]

## 2010-10-24 NOTE — H&P (Signed)
NAMEJUNI, [name]              ACCOUNT NO.:  [id number]   MEDICAL RECORD NO.:  [id number]          PATIENT TYPE:  INP   LOCATION:  3736                         FACILITY:  [facility]   PHYSICIAN:  Madolyn Frieze. Jens Som, MD, FACCDATE OF BIRTH:  [birth year]   DATE OF ADMISSION:  11/06/2007  DATE OF DISCHARGE:                              HISTORY & PHYSICAL   HISTORY OF PRESENT ILLNESS:  [name] is a 65 year old female with  past medical history of hypothyroidism and breast CA who is being  admitted with atrial fibrillation.  She has no prior cardiac history.  Note, she has been treated with Armour Thyroid as well as Cytomel  alternating.  She has been on as much as 50 of Cytomel t.i.d.  Note, I  do have laboratories from December 2008.  At that time, it was noted  that her T3 was 9.5.  Her TSH was 0.010.  Her dose had not been changed.  She was seen by Dr. Darnelle Catalan recently and noted to be tachycardic.  He  recommended that those medications be discontinued and referred her for  evaluation.  Note, she occasionally feels her heart skip but this does  not appear to be significantly bothersome.  She has not had chest pain,  presyncope, or syncope.  She has mild dyspnea with more extreme  activities, it has been now with routine activities.  There is no  orthopnea, PND, or pedal edema.  She has not lost any weight recently.  Her electrocardiogram today shows atrial fibrillation with rapid  ventricular response and she has been admitted for that reason.   CURRENT MEDICATIONS:  Her present medications include a multivitamin,  OsteoPhase, vitamin B complex, Healthy Aging, and Brain Protex.  She is  also on [employer].   ALLERGIES:  She has allergies to SULFA and PENICILLIN.   SOCIAL HISTORY:  She does not smoke.  She does occasionally consume  alcohol.   FAMILY HISTORY:  Significant for atrial fibrillation in her mother.  Her  father has coronary artery disease.   PAST MEDICAL HISTORY:   Significant for hypothyroidism.  She has had a  prior breast cancer removed from right breast and did receive radiation  but did not receive chemotherapy.  She has had prior nasal surgery but  there is no other past medical history noted.   REVIEW OF SYSTEMS:  She denies any fevers or chills or productive cough.  There is no hemoptysis.  There is no dysphagia, odynophagia, melena, or  hematochezia.  There is no dysuria or hematuria.  There is no rash or  seizure activity.  There is no orthopnea, PND, or pedal edema.  Remaining systems are negative.   PHYSICAL EXAMINATION:  VITAL SIGNS:  Today, shows a blood pressure of  132/65 and pulse is 164.  The patient weighs 145 pounds.  GENERAL:  She is well-developed and well-nourished in no acute distress.  She does not appear to be depressed.  SKIN:  Warm and dry.  BACK:  Normal.  HEENT:  Normal with normal eyelids.  NECK:  Supple with normal upstroke bilaterally.  I  cannot appreciate  bruits.  There is no jugular venous distention and no thyromegaly is  noted.  CHEST:  Clear to auscultation.  Normal expansion.  CARDIOVASCULAR:  Tachycardic rate with an irregular rhythm.  There are  no murmurs, rubs ,or gallops noted.  ABDOMEN:  Nontender and nondistended.  Positive bowel sounds.  No  hepatosplenomegaly.  No mass appreciated.  There is no abdominal bruit.  EXTREMITIES:  There is no peripheral clubbing.  She has 2+ femoral  pulses bilaterally.  No bruits.  Her extremities show no edema.  I can  palpate no cords.  She has 2+ dorsalis pedis pulses bilaterally.  NEUROLOGIC:  Grossly intact.   DIAGNOSES:  1. New onset atrial fibrillation - [name] is in atrial      fibrillation with a rapid ventricle response at a rate of 160.  We      will plan to admit to telemetry and begin IV Cardizem for rate      control.  I will also begin intravenous heparin.  We will check      free T3 and free T4 as well as a TSH.  I think her atrial       fibrillation is most likely due to excessive Armour Thyroid and      Cytomel.  These were on hold.  She may require endocrine consult.      Given that this is most likely related to hyperthyroidism, she may      require Coumadin for short term.  She is somewhat hesitant but we      will again review this tomorrow morning.  We will also check an      echocardiogram to requantify her left ventricular function.  2. Hypothyroidism - She will most likely need a lower dose of thyroid      replacement in the future.  3. History of breast cancer.      Madolyn Frieze Jens Som, MD, [facility]  Electronically Signed     BSC/MEDQ  D:  11/06/2007  T:  11/07/2007  Job:  [id number]

## 2010-10-24 NOTE — Op Note (Signed)
NAMEANASTASHIA, WESTERFELD              ACCOUNT NO.:  [id number]   MEDICAL RECORD NO.:  [id number]          PATIENT TYPE:  AMB   LOCATION:  SDC                           FACILITY:  [facility]   PHYSICIAN:  Hal Morales, M.D.DATE OF BIRTH:  01/08/1946   DATE OF PROCEDURE:  08/09/2008  DATE OF DISCHARGE:                               OPERATIVE REPORT   PREOPERATIVE DIAGNOSES:  Postmenopausal bleeding and endometrial polyps.   POSTOPERATIVE DIAGNOSES:  Postmenopausal bleeding and endometrial  polyps.   OPERATION:  Operative hysteroscopy and resection of endometrial polyp,  curetting of endometrial cavity.   SURGEON:  Vanessa P. Haygood, MD   ANESTHESIA:  General LMA.   ESTIMATED BLOOD LOSS:  Less than 5 mL.   COMPLICATIONS:  None.   FINDINGS:  The endometrial cavity was essentially atrophic.  There was a  1-cm polyp in the midportion of the posterior endometrial cavity.  The  ostia were normal bilaterally.   PROCEDURE:  The patient was taken to the operating room after  appropriate identification and placed on the operating table.  After the  attainment of adequate general anesthesia, she was placed in the  lithotomy position.  The perineum and vagina were prepped with multiple  layers of Betadine and a red Robinson catheter was used to empty the  bladder.  The perineum was draped as a sterile field.  A Graves speculum  was placed in the vagina and a paracervical block achieved with a total  of 10 mL of 2% Xylocaine in the 5 and 7 o'clock positions.  A single-  tooth tenaculum was placed on the anterior cervix.  The uterus was  sounded to 7 cm.  The cervix was then dilated to accommodate the  diagnostic hysteroscope.  It was placed in the endometrial cavity and  the above-noted findings made and documented.  The cervix was further  dilated to accommodate the operative hysteroscope and that was used to  resect the posterior endometrial polyp.  The remainder of the  endometrial cavity  was then curetted with minimal curettings obtained.  All instruments were then removed from the vagina and the patient was  awakened from general anesthesia and taken to the recovery room in  satisfactory condition having tolerated the procedure well with sponge  and instrument counts correct.   She was given a dose of Toradol 30 mg IV and 30 mg IM just prior to  ending the procedure.   SPECIMENS TO PATHOLOGY:  Endometrial curettings which were minimal and  endometrial polyp.   DISCHARGE INSTRUCTIONS:  Printed instructions from the [facility] [place]  for D and C.   DISCHARGE MEDICATIONS:  The patient's admission medications plus  ibuprofen over-the-counter 600 mg p.o. q.6 h. p.r.n. pain.   FOLLOWUP:  Followup is in 2 weeks.      Hal Morales, M.D.  Electronically Signed     VPH/MEDQ  D:  08/09/2008  T:  08/10/2008  Job:  [id number]

## 2010-10-24 NOTE — Assessment & Plan Note (Signed)
[facility] OFFICE NOTE   [name], [name]                     MRN:          [id number]  DATE:11/18/2007                            DOB:          19-Apr-1946    [name] is a pleasant 65 year old female with a past medical history  of breast cancer as well as hypothyroidism, who I recently admitted to  [facility] with new-onset atrial fibrillation.  Noted at that  time, she had been on the Armour Thyroid as well as Cytomel and her TSH  in December 2008 was noted to be significantly reduced.  We did place  the patient on Cardizem for rate control.  She also had an  echocardiogram performed that showed normal LV function.  There was mild  to moderate mitral regurgitation.  The left atrium is mildly dilated.  There was mild tricuspid regurgitation and the right atrium was mildly  dilated.   Laboratories showed a TSH of 0.004 and her T4 was elevated at 0.78.  Her  T3 was 2.3.  We felt that her atrial fibrillation was most likely due to  the excessive dose of thyroid replacement hormone.  We did discontinue  all this and she was discharged home on aspirin and Cardizem.  We did  recommend Coumadin transiently, but she refused this.  Since discharge,  she has done extremely well.  She denies any dyspnea, chest pain,  palpitations, or syncope.  She feels like she converted to sinus rhythm  on November 12, 2007.   CURRENT MEDICATIONS:  1. Multivitamin and multiple supplements.  2. She is also taking enteric-coated aspirin 325 mg p.o. daily.  3. Cardizem CD 240 mg p.o. daily.   PHYSICAL EXAMINATION:  Blood pressure of 162/82 and recheck was 158/90.  Her pulse was 91.  She weighs 140 pounds.  HEENT:  Normal.  NECK:  Supple.  CHEST:  Clear.  CARDIOVASCULAR:  Regular rhythm.  ABDOMEN:  She has no tenderness.  EXTREMITIES:  No edema.   Her electrocardiogram shows a sinus rhythm at a rate of 91.  There are  nonspecific ST changes.   DIAGNOSES:  1. Atrial fibrillation, now converted to normal sinus rhythm - Ms.      Anthony has converted to normal sinus rhythm.  I think her atrial      fibrillation was most likely due to the excessive dose of thyroid      replacement.  Her TSH was low.  She is now off all these      medications and she has converted to sinus rhythm.  We will plan to      continue with the Cardizem at present dose in case her atrial      fibrillation recurs, as well as aspirin.  She is not interested in      Coumadin.  Note, her LV function is normal.  If she has no      recurrences when I see her back in 6 months, then we will most      likely discontinue her aspirin and Cardizem as she does  have an      inciting event (the higher dose of the thyroid replacement pills).  2. Mild to moderate mitral regurgitation on her echocardiogram - we      will plan to repeat this in 6 months.  3. Hypothyroidism - I have asked her to follow up with an      endocrinologist as now that her medications have been discontinued,      she will most likely develop some degree of hypothyroidism.  She      would like to see Dr. Evlyn Kanner and we will arrange this.  4. Elevated blood pressure - we will track this and adjust her regimen      as indicated.  5. History of breast cancer.   I will see her back in 6 months in [place].     Madolyn Frieze Jens Som, MD, [facility]  Electronically Signed    BSC/MedQ  DD: 11/18/2007  DT: 11/19/2007  Job #: [id number]

## 2010-10-24 NOTE — Assessment & Plan Note (Signed)
[facility]                            CARDIOLOGY OFFICE NOTE   NAME:MCCLAMBLochlyn, [name]                      MRN:          [id number]  DATE:05/13/2008                            DOB:          08/12/1945    [name] is a patient that I last saw on November 18, 2007.  She has a  history of breast cancer as well as hypothyroidism.  When I met her, she  had developed atrial fibrillation and during admission, we realized that  she had been placed on Armour Thyroid as well as Cytomel and her TSH in  December 2008 had been reduced.  She was placed on Cardizem for rate  control as well as aspirin (she had refused Coumadin).  An  echocardiogram showed normal LV function with mild-to-moderate mitral  regurgitation.  There was mild left atrial enlargement and mild  tricuspid regurgitation.  The right atrium is mildly dilated.  We did  stop her thyroid replacements and her atrial fibrillation resolved.  I  last saw her on November 18, 2007, and she was in sinus rhythm.  She also saw  Dr. Daleen Squibb in July and was still in sinus rhythm.  Since I last saw her,  she denies any dyspnea on exertion, orthopnea, PND, pedal edema,  palpitations, presyncope, syncope, or exertional chest pain.   MEDICATIONS:  1. Cardizem 240 mg p.o. daily.  2. Omega fish oil.  3. Multivitamin.  4. Vitamin E.  5. Vitamin C.  6. Herbal heart regimen.  7. Vitamin B12.  8. Garlic.  9. Progesterone.  10.Over-the-counter thyroid medication.   PHYSICAL EXAMINATION:  VITAL SIGNS:  Today shows a blood pressure  initially was 158/90.  When I rechecked it was 144/100.  The pulse is  91.  HEENT:  Normal.  NECK:  Supple.  CHEST:  Clear.  CARDIOVASCULAR:  Regular rhythm.  ABDOMEN:  No tenderness.  EXTREMITIES:  No edema.   Her electrocardiogram shows a sinus rhythm at a rate of 85.  The axis is  normal.  There is left ventricular hypertrophy.   DIAGNOSES:  1. Atrial fibrillation - The patient remains  in sinus rhythm and I      think her previous bouts were related to excessive dose of thyroid      replacement.  She is now having this adjusted and I do not think we      need to continue her aspirin.  Note, she has discontinued this on      her own regardless.  She has been on Cardizem, but I do not think      she will require this any further.  She also feels that this does      not controlling her blood pressure.  We therefore discontinued      this.  2. Hypertension - I spent approximately 20-30 minutes with [name]      in the office today discussing her blood pressure.  She is very      concerned about this.  She has kept the records and her systolic      has  run in the 150-160 range and her diastolic is typically in the      70 range.  She does not feel that the Cardizem is working.  We will      therefore discontinue that medication, instead, we will treat with      lisinopril HCT 10/12.5 mg p.o. daily.  I did review the side      effects of lisinopril including increased potassium and cough.  We      will check a BMET in 1 week to follow her potassium and renal      function.  We will increase this as needed to treat her blood      pressure.  I have also explained her that I do not think that her      previous excessive thyroid replacement is responsible for her blood      pressure at present.  I feel that she most likely has essential      hypertension and will need therapy long term.  We will schedule to      have renal Dopplers to make sure that she does not have renal      artery stenosis contributing.  We also reviewed lifestyle      modification.  She will continue to track her blood pressure at      home.  I will see her back in approximately 4-6 weeks and we will      adjust her lisinopril as needed.  3. Mild-to-moderate mitral regurgitation - We will plan to repeat her      echocardiogram.  4. Hypothyroidism - This is being managed by her primary care       physician.  5. History of breast cancer.     Madolyn Frieze Jens Som, MD, [facility]     BSC/MedQ  DD: 05/13/2008  DT: 05/14/2008  Job #: [id number]   cc:   Archie Balboa  [facility]

## 2010-10-27 NOTE — Op Note (Signed)
NAME:  [name], [name]                        ACCOUNT NO.:  [id number]   MEDICAL RECORD NO.:  [id number]                   PATIENT TYPE:  AMB   LOCATION:  DSC                                  FACILITY:  [facility]   PHYSICIAN:  Currie Paris, M.D.           DATE OF BIRTH:  21-Sep-1945   DATE OF PROCEDURE:  05/28/2002  DATE OF DISCHARGE:                                 OPERATIVE REPORT   OFFICE MEDICAL RECORD NUMBER:  #[id number]   PREOPERATIVE DIAGNOSIS:  Carcinoma, upper outer quadrant, right breast.   POSTOPERATIVE DIAGNOSIS:  Carcinoma, upper outer quadrant, right breast.   OPERATION:  Excision right breast cancer, blue dye injection, axillary lymph  node dissection.   SURGEON:  Currie Paris, M.D.   ANESTHESIA:  General.   CLINICAL HISTORY:  The patient is a 65 year old who was  recently found to  have a right breast mass in the upper outer quadrant of  the right breast. A  core biopsy showed invasive  ductal  carcinoma with some DCIS. She was  clinically stage 1. After discussion of the alternatives, we elected to  proceed to a right lumpectomy with sentinel node dissection.   DESCRIPTION OF PROCEDURE:  The patient was seen  in the holding area and had  no further questions. The right side had already been injected and was  confirmed as the operative side. She was taken to the operating room and  after satisfactory general anesthesia had been obtained, I injected some  blue dye in the right subareolar area using 4 cc of Lymphazurin blue.  The  mass itself was readily palpable and was marked, and I used an ultrasound to  confirm it. Once that was done I also identified a hot area in the right  axilla and marked it. The breast was then prepped and draped. A short  axillary incision was made directly over the hot area and this was somewhat  anterior and right at the inferior margin of the hair-bearing area.  Subcutaneous sutures were divided and the axilla properly  entered. With a  short brief dissection I found about a 1 to 1.5 cm blue node which had  counts up to 3500. This was excised with cautery. Further dissection and  palpation revealed no blue or palpable nodes. Background counts which would  have been in the 100s with the positive node itself as above at 3500. I had  no counts other than some between 0 and 30 to 40. At this point I concluded  the axillary dissection and left a small tack present. Attention was turned  to the breast and a curvilinear incision was made over the cancer, which was  in the upper outer quadrant. I divided the subcutaneous tissues a brief  distance and using electrocautery, tried to do an excision extending around  the mass in all directions, and was in essentially what appeared to be fatty  tissue in  all directions including deep. The deep margin was just  superficial to the muscle fascia. The mass was palpable within this and the  fatty tissue over it seemed to move nicely, so I thought I had an adequate  margin at least in terms of the invasive cancer. There were no suspicious  areas at the margin for DCIS. This wound was checked for hemostasis, and  once it was dry, I injected some Marcaine. I used four small clips to mark  the periphery of the lumpectomy and some larger clips to mark the  superficial and deep margins. This incision was closed with some 3-0 Vicryl  to reapproximate the subcu and then  some 4-0 Monocryl subcuticular. The  lumpectomy cavity itself was not closed. Attention was returned back to the  axillary incision and it was checked for hemostasis and appeared dry. I  injected a little Marcaine and closed this with 3-0 Vicryl and 4-0 Monocryl  subcuticular. Pathology initially called back with a report that the  sentinel node was  negative, and subsequently the touch preps on the lumpectomy were negative.  At this point the procedure was concluded. Steri-Strips were applied to the  incisions  and sterile dressings. The patient tolerated the procedure well.  There were no complications. All counts were correct.                                               Currie Paris, M.D.    CJS/MEDQ  D:  05/28/2002  T:  05/29/2002  Job:  [id number]   cc:   Dr. Jeralyn Ruths

## 2010-10-27 NOTE — Assessment & Plan Note (Signed)
[facility]                            CARDIOLOGY OFFICE NOTE   GENEVIENE, TESCH                       MRN:          [id number]  DATE:05/13/2008                            DOB:          March 10, 1946    Ms. Broce is a patient that I last saw on November 18, 2007.  She has a  history of breast cancer as well as hypothyroidism.  When I met her, she  had developed atrial fibrillation and during admission, we realized that  she had been placed on Armour Thyroid as well as Cytomel and her TSH in  December 2008 had been reduced.  She was placed on Cardizem for rate  control as well as aspirin (she had refused Coumadin).  An  echocardiogram showed normal LV function with mild-to-moderate mitral  regurgitation.  There was mild left atrial enlargement and mild  tricuspid regurgitation.  The right atrium is mildly dilated.  We did  stop her thyroid replacements and her atrial fibrillation resolved.  I  last saw her on November 18, 2007, and she was in sinus rhythm.  She also saw  Dr. Daleen Squibb in July and was still in sinus rhythm.  Since I last saw her,  she denies any dyspnea on exertion, orthopnea, PND, pedal edema,  palpitations, presyncope, syncope, or exertional chest pain.   MEDICATIONS:  1. Cardizem 240 mg p.o. daily.  2. Omega fish oil.  3. Multivitamin.  4. Vitamin E.  5. Vitamin C.  6. Herbal heart regimen.  7. Vitamin B12.  8. Garlic.  9. Progesterone.  10.Over-the-counter thyroid medication.   PHYSICAL EXAMINATION:  VITAL SIGNS:  Today shows a blood pressure  initially was 158/90.  When I rechecked it was 144/100.  The pulse is  91.  HEENT:  Normal.  NECK:  Supple.  CHEST:  Clear.  CARDIOVASCULAR:  Regular rhythm.  ABDOMEN:  No tenderness.  EXTREMITIES:  No edema.   Her electrocardiogram shows a sinus rhythm at a rate of 85.  The axis is  normal.  There is left ventricular hypertrophy.   DIAGNOSES:  1. Atrial fibrillation - The patient remains  in sinus rhythm and I      think her previous bouts were related to excessive dose of thyroid      replacement.  She is now having this adjusted and I do not think we      need to continue her aspirin.  Note, she has discontinued this on      her own regardless.  She has been on Cardizem, but I do not think      she will require this any further.  She also feels that this does      not controlling her blood pressure.  We therefore discontinued      this.  2. Hypertension - I spent approximately 20-30 minutes with [name]      in the office today discussing her blood pressure.  She is very      concerned about this.  She has kept the records and her systolic  has run in the 150-160 range and her diastolic is typically in the      70 range.  She does not feel that the Cardizem is working.  We will      therefore discontinue that medication, instead, we will treat with      lisinopril HCT 10/12.5 mg p.o. daily.  I did review the side      effects of lisinopril including increased potassium and cough.  We      will check a BMET in 1 week to follow her potassium and renal      function.  We will increase this as needed to treat her blood      pressure.  I have also explained her that I do not think that her      previous excessive thyroid replacement is responsible for her blood      pressure at present.  I feel that she most likely has essential      hypertension and will need therapy long term.  We will schedule to      have renal Dopplers to make sure that she does not have renal      artery stenosis contributing.  We also reviewed lifestyle      modification.  She will continue to track her blood pressure at      home.  I will see her back in approximately 4-6 weeks and we will      adjust her lisinopril as needed.  3. Mild-to-moderate mitral regurgitation - We will plan to repeat her      echocardiogram.  4. Hypothyroidism - This is being managed by her primary care       physician.  5. History of breast cancer.     Madolyn Frieze Jens Som, MD, [facility]  Electronically Signed    BSC/MedQ  DD: 05/13/2008  DT: 05/14/2008  Job #: [id number]   cc:   Archie Balboa  [facility]

## 2010-11-14 ENCOUNTER — Encounter (INDEPENDENT_AMBULATORY_CARE_PROVIDER_SITE_OTHER): Payer: Self-pay | Admitting: Surgery

## 2010-11-28 ENCOUNTER — Encounter (HOSPITAL_BASED_OUTPATIENT_CLINIC_OR_DEPARTMENT_OTHER): Payer: BC Managed Care – PPO | Admitting: Oncology

## 2010-11-28 DIAGNOSIS — Z853 Personal history of malignant neoplasm of breast: Secondary | ICD-10-CM

## 2010-12-11 ENCOUNTER — Other Ambulatory Visit: Payer: Self-pay | Admitting: Cardiology

## 2010-12-27 ENCOUNTER — Encounter: Payer: Self-pay | Admitting: Internal Medicine

## 2011-03-05 ENCOUNTER — Encounter (INDEPENDENT_AMBULATORY_CARE_PROVIDER_SITE_OTHER): Payer: Self-pay | Admitting: General Surgery

## 2011-03-05 DIAGNOSIS — C50911 Malignant neoplasm of unspecified site of right female breast: Secondary | ICD-10-CM | POA: Insufficient documentation

## 2011-03-07 ENCOUNTER — Ambulatory Visit (INDEPENDENT_AMBULATORY_CARE_PROVIDER_SITE_OTHER): Payer: Medicare Other | Admitting: Surgery

## 2011-03-07 ENCOUNTER — Ambulatory Visit (INDEPENDENT_AMBULATORY_CARE_PROVIDER_SITE_OTHER): Payer: Self-pay | Admitting: Surgery

## 2011-03-07 ENCOUNTER — Encounter (INDEPENDENT_AMBULATORY_CARE_PROVIDER_SITE_OTHER): Payer: Self-pay | Admitting: Surgery

## 2011-03-07 VITALS — BP 142/84 | HR 100 | Temp 97.0°F | Resp 18 | Ht 69.0 in | Wt 153.4 lb

## 2011-03-07 DIAGNOSIS — Z853 Personal history of malignant neoplasm of breast: Secondary | ICD-10-CM

## 2011-03-07 LAB — CBC
MCHC: 34.3
MCV: 95.3
Platelets: 221
RBC: 3.83 — ABNORMAL LOW
RBC: 4.19
RDW: 13.1
WBC: 6.9

## 2011-03-07 LAB — COMPREHENSIVE METABOLIC PANEL
AST: 29
CO2: 31
Calcium: 9.7
Creatinine, Ser: 0.63
GFR calc Af Amer: >60
GFR calc non Af Amer: >60
Glucose, Bld: 98
Total Protein: 7.3

## 2011-03-07 LAB — TSH: TSH: 0.004 — ABNORMAL LOW

## 2011-03-07 LAB — T3, FREE: T3, Free: 2.3 (ref 2.3–4.2)

## 2011-03-07 LAB — HEPARIN LEVEL (UNFRACTIONATED): Heparin Unfractionated: 0.59

## 2011-03-07 NOTE — Patient Instructions (Signed)
You need a mammogram, but if you don't want to do that we will try to get an ultrasound done.  I will see you again in a year.

## 2011-03-07 NOTE — Progress Notes (Signed)
NAME: [name] [facility]       DOB: 1946-02-13           DATE: 03/07/2011       MRN: [id number]   [name] is a 64 y.o.Marland Kitchenfemale who presents for routine followup of her Right breast cancerdiagnosed in Dec 2003 and treated with lumpectomy and radiation. She has no problems or concerns on either side.  PFSH: She has had no significant changes since the last visit here.  ROS: There have been no significant changes since the last visit here  EXAM: General: The patient is alert, oriented, generally healty appearing, NAD. Mood and affect are normal.  Breasts:  The breasts are firm throughout but there is no dominant mass. They are not tender. The lumpectomy site shows no suggestion of recurrence. There almost no visible radiation changes. This exam I believe is stable from her last visit.  Lymphatics: She has no axillary or supraclavicular adenopathy on either side.  Extremities: Full ROM of the surgical side with no lymphedema noted.  Data Reviewed: Last mammogram was over a year ago. She does not like to have mammograms and prefers ultrasounds but has had difficulty finding a radiologist will do only an ultrasound. She is concerned about the radiation exposure.  Impression: Doing well, with no evidence of recurrent cancer or new cancer  Plan: Will continue to follow up on an annual basis here. Of note is the fact that she prefers not send a copy of this note to her other physicians at this time. She has been concerned about her medical information the disseminated and wished to keep it private other than this he would have access to the her chart in Epic.  I told her that the standard of care for her would be to get annual mammograms and not rely on ultrasounds which can be inaccurate. I did however offer to write an order for a bilateral breast ultrasound to see if there is a radiology group it would at least to do a screening ultrasound.

## 2011-04-12 ENCOUNTER — Ambulatory Visit (INDEPENDENT_AMBULATORY_CARE_PROVIDER_SITE_OTHER): Payer: Self-pay | Admitting: Surgery

## 2011-05-12 ENCOUNTER — Telehealth: Payer: Self-pay | Admitting: Oncology

## 2011-05-12 NOTE — Telephone Encounter (Signed)
per pof 06/19 called pt and scheduled her appt for aug2013

## 2011-08-07 ENCOUNTER — Ambulatory Visit (INDEPENDENT_AMBULATORY_CARE_PROVIDER_SITE_OTHER): Payer: Medicare Other | Admitting: Obstetrics and Gynecology

## 2011-08-07 DIAGNOSIS — N952 Postmenopausal atrophic vaginitis: Secondary | ICD-10-CM

## 2011-08-07 DIAGNOSIS — N390 Urinary tract infection, site not specified: Secondary | ICD-10-CM

## 2011-08-07 DIAGNOSIS — N898 Other specified noninflammatory disorders of vagina: Secondary | ICD-10-CM

## 2011-08-07 DIAGNOSIS — C50919 Malignant neoplasm of unspecified site of unspecified female breast: Secondary | ICD-10-CM

## 2011-09-19 ENCOUNTER — Telehealth: Payer: Self-pay

## 2011-09-19 NOTE — Telephone Encounter (Signed)
PC TO PT PER MESSAGE,"QUESTIONS RGDG BP READINGS FROM HER LAST 2 OFFICE VISITS". LM ON VM TO CB.

## 2011-09-21 NOTE — Telephone Encounter (Signed)
PC TO PT. PT REQUEST OFFICE DATE PRIOR TO LAST OV. TOLD PT OV 05/25/11. PT VOICES UNDERSTANDING.

## 2012-01-31 ENCOUNTER — Ambulatory Visit (HOSPITAL_BASED_OUTPATIENT_CLINIC_OR_DEPARTMENT_OTHER): Payer: BC Managed Care – PPO | Admitting: Oncology

## 2012-01-31 VITALS — BP 124/85 | HR 103 | Temp 99.2°F | Resp 20 | Ht 69.0 in | Wt 153.8 lb

## 2012-01-31 DIAGNOSIS — Z853 Personal history of malignant neoplasm of breast: Secondary | ICD-10-CM

## 2012-01-31 NOTE — Progress Notes (Signed)
ID: Mykael Trott Bushway   DOB: [birth year]  MR#: [id number]  VHQ#:[id number]  HISTORY OF PRESENT ILLNESS:  INTERVAL HISTORY: Jency returns today for routine followup of her breast cancer. The interval history is significant for her having 2 grandchildren born 3 weeks apart, something she is very excited about. She continues to go to the gym about twice a week, and does some walking regularly otherwise.  REVIEW OF SYSTEMS: A detailed review of systems today was entirely noncontributory.  PAST MEDICAL HISTORY: Past Medical History  Diagnosis Date  . Breast tumor   . Nose trouble   . Thyroid disease   . Hernia   . Cancer   . Irregular heart rate 2011  . Atrial fib/flutter, transient 2011  . HTN (hypertension)     PAST SURGICAL HISTORY: Past Surgical History  Procedure Date  . Breast tumor 2004  . Tonsillectomy 1953  . Reconstruction of nose 1985  . Tubal ligation     FAMILY HISTORY Family History  Problem Relation Age of Onset  . Stroke Mother   . Heart attack Father     GYNECOLOGIC HISTORY:  SOCIAL HISTORY:    ADVANCED DIRECTIVES:  HEALTH MAINTENANCE: History  Substance Use Topics  . Smoking status: Never Smoker   . Smokeless tobacco: Not on file  . Alcohol Use: 0.6 oz/week    1 Glasses of wine per week     1-2 glasses daily      Colonoscopy:  PAP:  Bone density:  Lipid panel:  Allergies  Allergen Reactions  . Penicillins   . Sulfonamide Derivatives     Current Outpatient Prescriptions  Medication Sig Dispense Refill  . Flaxseed, Linseed, (FLAXSEED OIL PO) Take by mouth daily.        . Omega-3 Fatty Acids (EQL OMEGA 3 FISH OIL PO) Take by mouth daily.          OBJECTIVE: Middle-aged white woman who appears fit Filed Vitals:   01/31/12 1354  BP: 124/85  Pulse: 103  Temp: 99.2 F (37.3 C)  Resp: 20     Body mass index is 22.71 kg/(m^2).    ECOG FS: 0  Sclerae unicteric Oropharynx clear No cervical or supraclavicular adenopathy Lungs no rales  or rhonchi Heart regular rate and rhythm; there is a slight respiratory variation, but no arrhythmia Abd benign MSK no focal spinal tenderness, no peripheral edema Neuro: nonfocal Breasts: the right breast is status post lumpectomy and radiation. There is no evidence of local recurrence. The right axilla is clear. The left breast is unremarkable.  LAB RESULTS: Lab Results  Component Value Date   WBC 7.7 08/20/2008   NEUTROABS 5.0 08/20/2008   HGB 14.1 08/20/2008   HCT 40.2 08/20/2008   MCV 95.6 08/20/2008   PLT 312 08/20/2008      Chemistry      Component Value Date/Time   NA 140 08/20/2008 0227   K 3.6 08/20/2008 0227   CL 106 08/20/2008 0227   CO2 27 08/20/2008 0227   BUN 8 08/20/2008 0227   CREATININE 0.60 08/20/2008 0227      Component Value Date/Time   CALCIUM 9.4 08/20/2008 0227   ALKPHOS 90 11/06/2007 2201   AST 29 11/06/2007 2201   ALT 21 11/06/2007 2201   BILITOT 0.7 11/06/2007 2201       No results found for this basename: LABCA2    No components found with this basename: LABCA125    No results found for this basename: INR:1;PROTIME:1 in the last  168 hours  Urinalysis    Component Value Date/Time   COLORURINE YELLOW 08/20/2008 0223   APPEARANCEUR CLEAR 08/20/2008 0223   LABSPEC 1.007 08/20/2008 0223   PHURINE 7.0 08/20/2008 0223   GLUCOSEU NEGATIVE 08/20/2008 0223   HGBUR NEGATIVE 08/20/2008 0223   BILIRUBINUR NEGATIVE 08/20/2008 0223   KETONESUR NEGATIVE 08/20/2008 0223   PROTEINUR NEGATIVE 08/20/2008 0223   UROBILINOGEN 0.2 08/20/2008 0223   NITRITE NEGATIVE 08/20/2008 0223   LEUKOCYTESUR NEGATIVE MICROSCOPIC NOT DONE ON URINES WITH NEGATIVE PROTEIN, BLOOD, LEUKOCYTES, NITRITE, OR GLUCOSE <1000 mg/dL. 08/20/2008 0223    STUDIES: No results found.  ASSESSMENT: 66 y.o. [place] woman status post right lumpectomy and sentinel lymph node biopsy December 2003 for a 2.1-cm grade 3 invasive ductal carcinoma with no lymph node involvement, the tumor being estrogen receptor  positive, progesterone receptor and HER2 negative, treated with radiation then Arimidex for approximately 5 months.  The patient refused further antiestrogen treatment or standard screening with mammography.   PLAN: Shantrice has been very upset with me because I included some personal material in my last dictation on her, which circulated to her other physicians. She felt very angry about this and like her confidentiality had been betrayed. She was very nice to come and tell me this in person and I appreciated that greatly. I apologized for including material in my dictation that seemed to her way too personal and inappropriate and in fact I think this is an issue that we need to become more sensitive to. I will make arrangements to discuss this issue with my partners at one of our business meetings and got permission from [place] to use her case in particular as an example, of course omitting her name.  We then discussed of issues regarding screening. She is still very reluctant to undergo mammography, but would be interested in having an ultrasound. She would agree to an MRI but of course that is prohibitively expensive. I wrote her a prescription for breast ultrasonography, but more importantly I am going to ask Jeralyn Ruths, one of our premier of mammographers here in town, to see if she feels she can possibly help [place] Citizen to some kind of screening study that does not involve radiation, even if it is entirely nonstandard. I'm sure Kalley would be glad to sign any waver regarding legal issues. The problem of course is one of  effectiveness: We don't know that ultrasonography alone is sensitive enough to really screen for breast cancer.  Finally, this is her 10th year anniversary. We did discuss the possibility of "getting out of the cancer business". What [place] Citizen would prefer to do is continue to be seen on a once a year basis here, with labs drawn in [place] as we have done previously. This is  being operationalized.  Mose Colaizzi C    01/31/2012  [id number]

## 2012-02-01 ENCOUNTER — Telehealth: Payer: Self-pay | Admitting: Oncology

## 2012-02-01 NOTE — Telephone Encounter (Signed)
lmonvm adviisng the pt of her aug 2014 appt

## 2012-02-04 ENCOUNTER — Telehealth: Payer: Self-pay | Admitting: Obstetrics and Gynecology

## 2012-02-04 ENCOUNTER — Other Ambulatory Visit: Payer: Self-pay

## 2012-02-04 ENCOUNTER — Other Ambulatory Visit (INDEPENDENT_AMBULATORY_CARE_PROVIDER_SITE_OTHER): Payer: BC Managed Care – PPO

## 2012-02-04 DIAGNOSIS — R109 Unspecified abdominal pain: Secondary | ICD-10-CM

## 2012-02-04 DIAGNOSIS — R8290 Unspecified abnormal findings in urine: Secondary | ICD-10-CM

## 2012-02-04 DIAGNOSIS — R82998 Other abnormal findings in urine: Secondary | ICD-10-CM

## 2012-02-04 LAB — POCT URINALYSIS DIPSTICK
Leukocytes, UA: NEGATIVE
Nitrite, UA: POSITIVE
Protein, UA: NEGATIVE
pH, UA: 6

## 2012-02-04 NOTE — Telephone Encounter (Signed)
CHANDRA/VPH PT °

## 2012-02-04 NOTE — Telephone Encounter (Signed)
Tc to pt. Pt c/o a ",strong urinary odor and lower back pain". No dysuria. No fever. Appt sched today for UA and urine culture per vh. Pt voices understanding.

## 2012-02-07 ENCOUNTER — Telehealth: Payer: Self-pay | Admitting: Obstetrics and Gynecology

## 2012-02-07 NOTE — Telephone Encounter (Signed)
Will have urine culture reviewed by vph and wil cb with recs.

## 2012-02-07 NOTE — Telephone Encounter (Signed)
Chandra/lab res 

## 2012-02-08 ENCOUNTER — Telehealth: Payer: Self-pay | Admitting: Obstetrics and Gynecology

## 2012-02-08 NOTE — Telephone Encounter (Signed)
Tc from pt per telephone call. Told pt vph recs. Pt voices understanding and will follow up as needed.

## 2012-02-08 NOTE — Telephone Encounter (Signed)
Lm on vm to cb per vh recs.

## 2012-02-08 NOTE — Telephone Encounter (Signed)
Tc to pt per telephone call. Lm on vm to cb. 

## 2012-02-08 NOTE — Telephone Encounter (Signed)
Chandra/[facility]PH PT

## 2012-02-08 NOTE — Telephone Encounter (Signed)
Urine culture was negative.  Pt will need appt with PCP or [place] (her choice) to discuss any further sx

## 2012-06-13 ENCOUNTER — Encounter: Payer: Self-pay | Admitting: Oncology

## 2012-07-16 ENCOUNTER — Telehealth: Payer: Self-pay | Admitting: Oncology

## 2012-07-16 NOTE — Telephone Encounter (Signed)
Pt called today and r/s 8/25 appt to 8/26.

## 2012-08-15 ENCOUNTER — Ambulatory Visit: Payer: Medicare Other | Admitting: Obstetrics and Gynecology

## 2012-08-21 ENCOUNTER — Ambulatory Visit (INDEPENDENT_AMBULATORY_CARE_PROVIDER_SITE_OTHER): Payer: Self-pay | Admitting: Surgery

## 2012-08-26 ENCOUNTER — Ambulatory Visit (INDEPENDENT_AMBULATORY_CARE_PROVIDER_SITE_OTHER): Payer: Medicare Other | Admitting: Surgery

## 2012-09-10 ENCOUNTER — Ambulatory Visit (INDEPENDENT_AMBULATORY_CARE_PROVIDER_SITE_OTHER): Payer: Medicare Other | Admitting: Surgery

## 2012-10-10 ENCOUNTER — Ambulatory Visit (INDEPENDENT_AMBULATORY_CARE_PROVIDER_SITE_OTHER): Payer: BC Managed Care – PPO | Admitting: Surgery

## 2012-12-04 IMAGING — US US SOFT TISSUE HEAD/NECK
1 series · 13 of 25 positions shown · non-contrast
Comparison: None

***ADDENDUM*** CREATED: 09/27/2010 [DATE]

There are several calcifications identified within the nodule in
the inferior pole of the right lobe.  These calcifications appear
coarsened and are nonspecific.
CLINICAL DATA: Chronic thyroiditis.
ULTRASOUND OF HEAD/NECK SOFT TISSUES
TECHNIQUE: Ultrasound examination of the head and neck soft
tissues was performed in the area of clinical concern.

[Series 1: us soft tissue head/neck · 0.07mm/px · 13 of 51 slices shown]
[im 1/51]
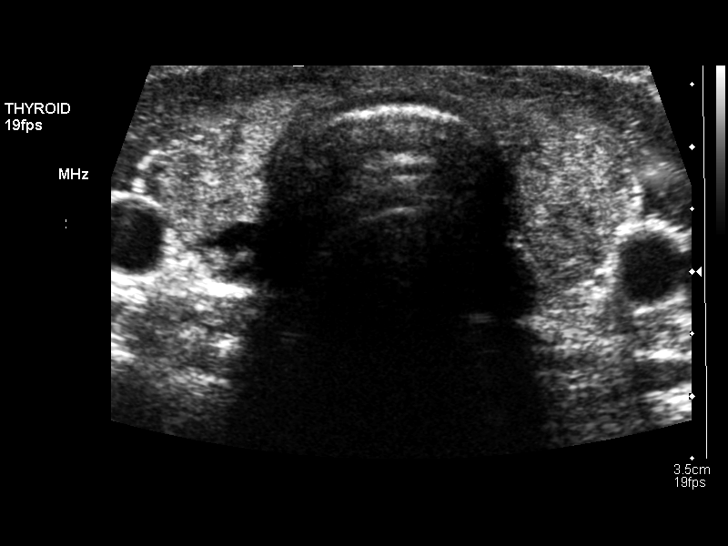
[im 5/51]
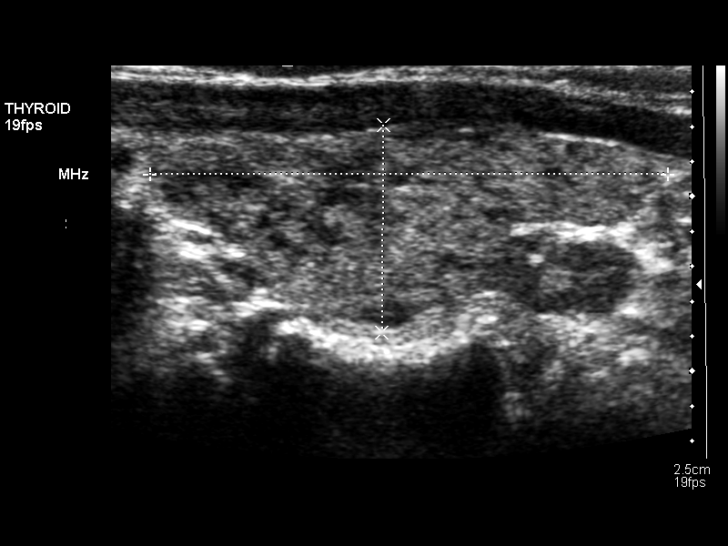
[im 9/51]
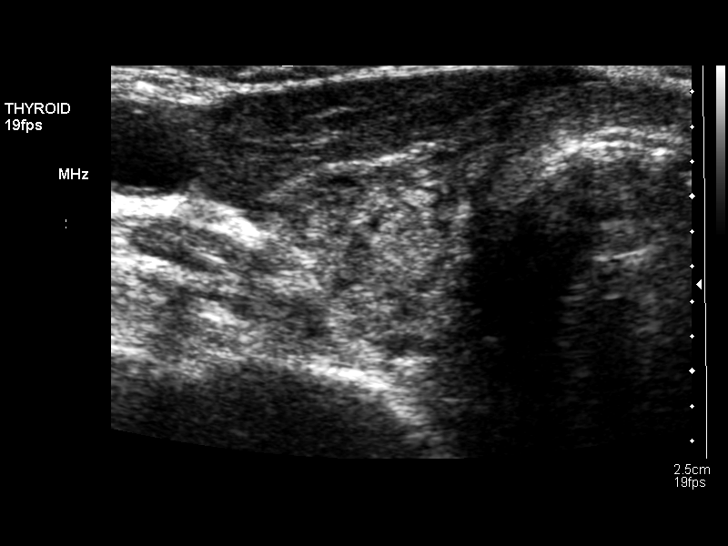
[im 13/51]
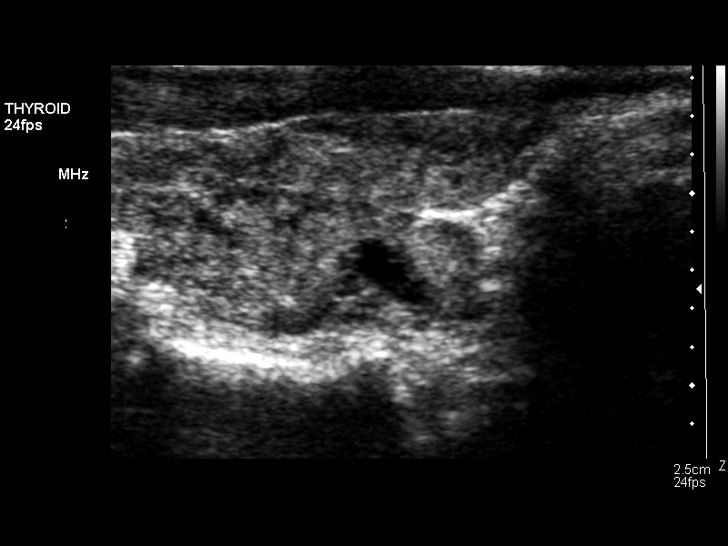
[im 17/51]
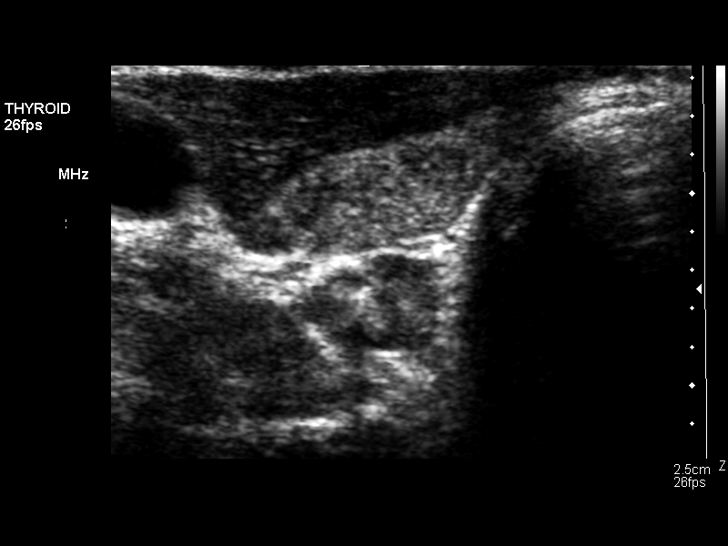
[im 21/51]
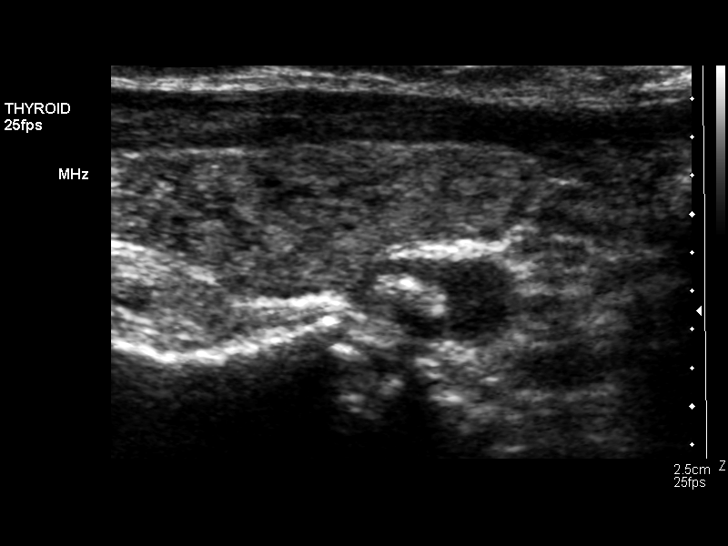
[im 26/51]
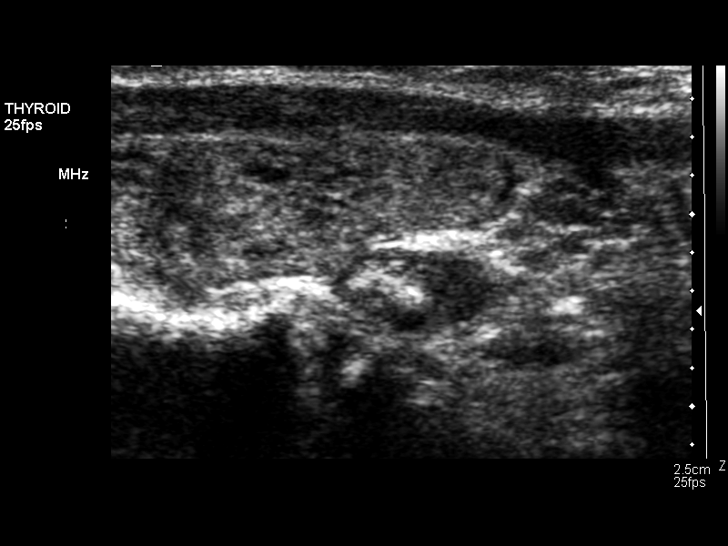
[im 30/51]
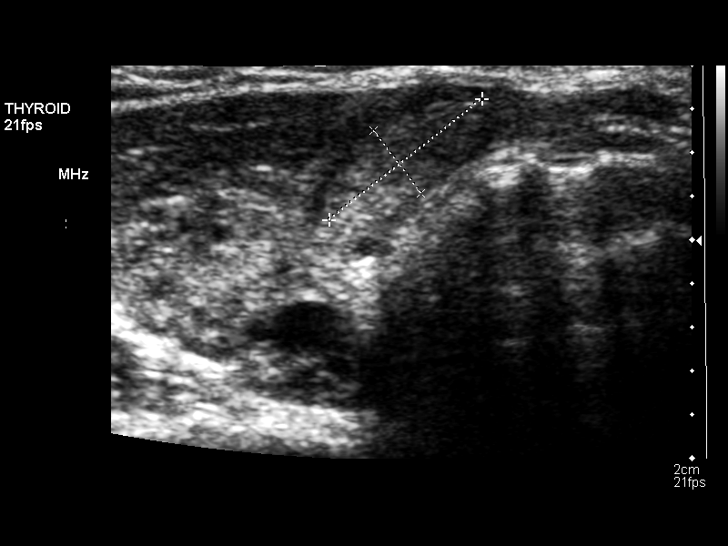
[im 34/51]
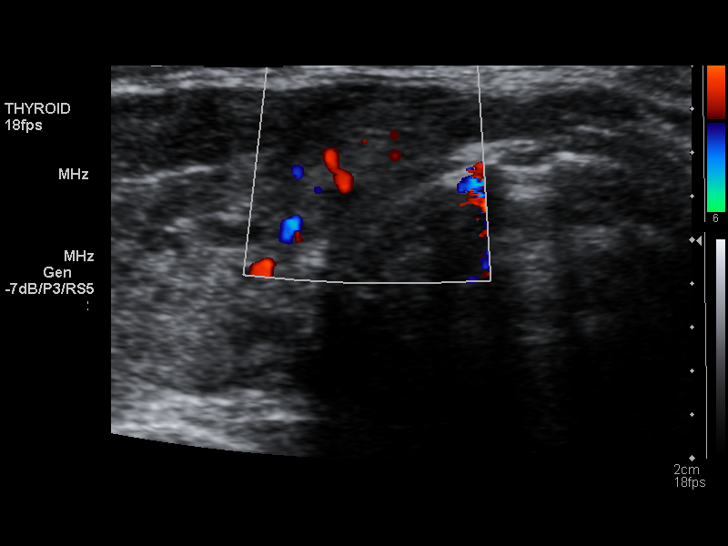
[im 38/51]
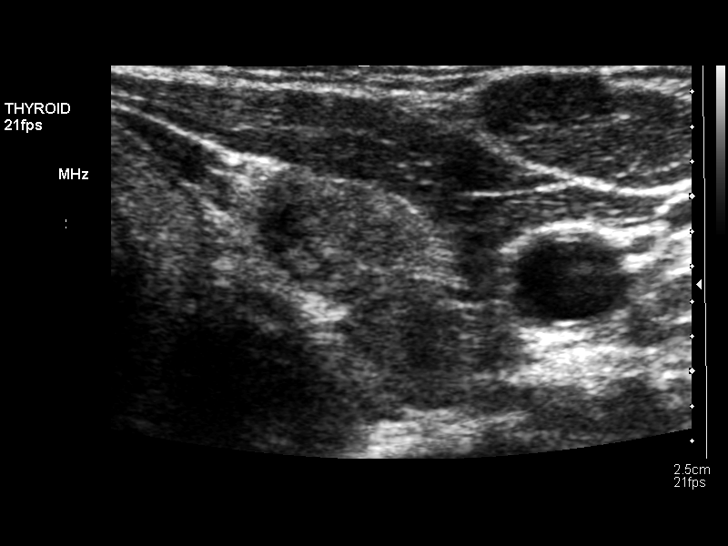
[im 42/51]
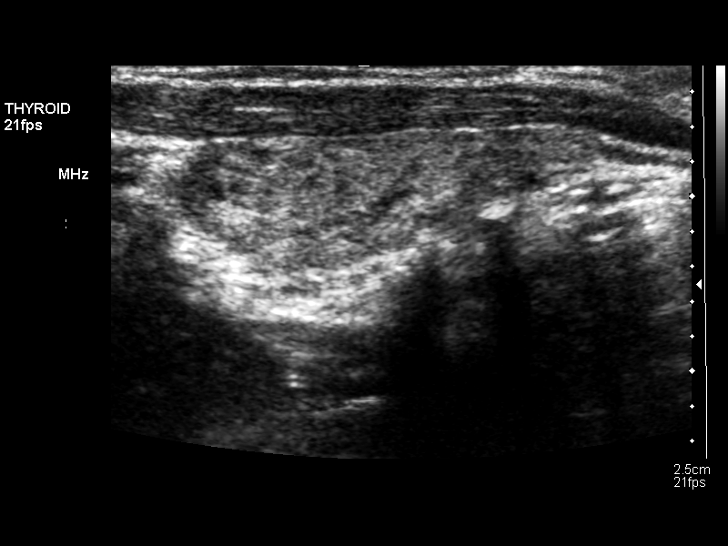
[im 46/51]
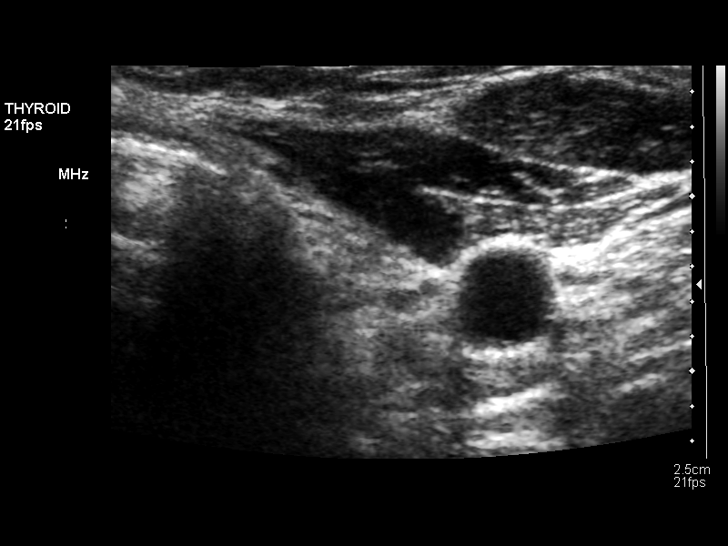
[im 51/51]
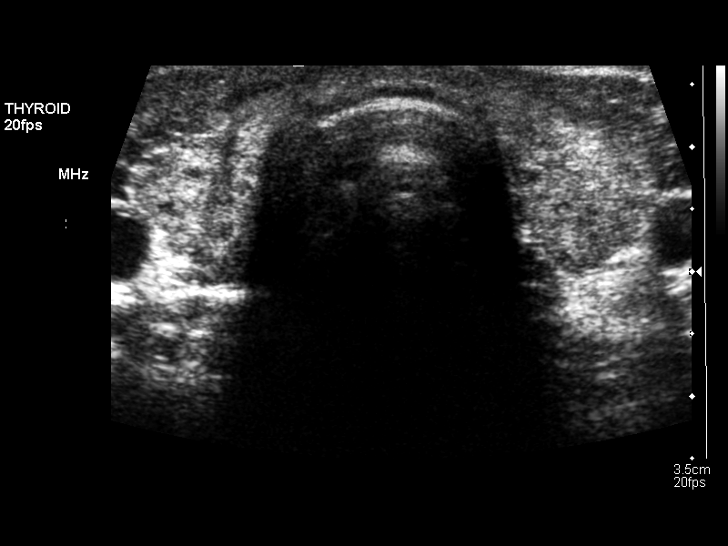

[13 of 25 positions shown; findings below may reference images not displayed]

FINDINGS: The right lobe of the thyroid gland measures 30 x 1.2 x
1.2 cm.  The left lobe of the thyroid gland measures 3.1 x 1.3 x
1.1 cm.  The isthmus measures 0.4 cm.  The thyroid echotexture is
heterogeneous.  There are three nodules within the right lobe of
the thyroid gland.  The largest is in the inferior pole and
measures 0.7 x 0.6 x 0.9 cm.  Within the mid pole of the right lobe
there is a cystic and solid nodule measuring 0.5 x 0.5 x 0.6 cm.
Within the upper pole of the right lobe there is a solid nodule
measuring 0.9 x 0.4 x 0.6 cm.  No nodules are identified within the
left lobe.
IMPRESSION: 1.  Heterogeneous thyroid gland with multiple nodules in the right
lobe. These nodules are nonspecific, but do not meet the size
criteria for which a biopsy would be indicated.This recommendation
follows the consensus statement:  Management of Thyroid Nodules
Detected as US:  Society of Radiologists in Ultrasound Consensus
online at :
[URL]

## 2013-02-02 ENCOUNTER — Ambulatory Visit: Payer: BC Managed Care – PPO | Admitting: Oncology

## 2013-02-03 ENCOUNTER — Telehealth: Payer: Self-pay | Admitting: *Deleted

## 2013-02-03 ENCOUNTER — Ambulatory Visit (HOSPITAL_BASED_OUTPATIENT_CLINIC_OR_DEPARTMENT_OTHER): Payer: Medicare Other | Admitting: Oncology

## 2013-02-03 VITALS — BP 124/84 | HR 112 | Temp 98.4°F | Resp 20 | Ht 69.0 in | Wt 156.6 lb

## 2013-02-03 DIAGNOSIS — Z853 Personal history of malignant neoplasm of breast: Secondary | ICD-10-CM

## 2013-02-03 NOTE — Progress Notes (Signed)
ID: [name]   DOB: 01-Aug-1945  MR#: [id number]  JYN#:[id number]  PCP: Anna Goody, NP GYN: SU:  OTHER MD:   INTERVAL HISTORY: [name] returns today for routine followup of her breast cancer. The interval history is significant for her having married and moved to [place], [place]. They have build a house and are very busy with all the final details.  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 She is not exercising quite as much as she was before, but is planning to resume her usual very active exercise program soon. Otherwise a detailed review of systems today was noncontributory  PAST MEDICAL HISTORY: Past Medical History  Diagnosis Date  . Breast tumor   . Nose trouble   . Thyroid disease   . Hernia   . Cancer   . Irregular heart rate 2011  . Atrial fib/flutter, transient 2011  . HTN (hypertension)     PAST SURGICAL HISTORY: Past Surgical History  Procedure Laterality Date  . Breast tumor  2004  . Tonsillectomy  1953  . Reconstruction of nose  1985  . Tubal ligation      FAMILY HISTORY Family History  Problem Relation Age of Onset  . Stroke Mother   . Heart attack Father     GYNECOLOGIC HISTORY:  SOCIAL HISTORY: Married to [name] and moved to [place]. Patient however has kept her name.   ADVANCED DIRECTIVES: In place  HEALTH MAINTENANCE: History  Substance Use Topics  . Smoking status: Never Smoker   . Smokeless tobacco: Not on file  . Alcohol Use: 0.6 oz/week    1 Glasses of wine per week     Comment: 1-2 glasses daily      Colonoscopy:  PAP:  Bone density:  Lipid panel:  Allergies  Allergen Reactions  . Penicillins   . Sulfonamide Derivatives     Current Outpatient Prescriptions  Medication Sig Dispense Refill  . Flaxseed, Linseed, (FLAXSEED OIL PO) Take by mouth daily.        . Omega-3 Fatty Acids (EQL OMEGA 3 FISH OIL  PO) Take by mouth daily.         No current facility-administered medications for this visit.    OBJECTIVE: Middle-aged white woman in no acute distress Filed Vitals:   02/03/13 1343  BP: 124/84  Pulse: 112  Temp: 98.4 F (36.9 Anthony)  Resp: 20     Body mass index is 23.12 kg/(m^2).    ECOG FS: 0  Sclerae unicteric Oropharynx clear No cervical or supraclavicular adenopathy Lungs no rales or rhonchi Heart regular rate and rhythm; no murmur appreciated  Abd benign MSK no focal spinal tenderness, no peripheral edema Neuro: nonfocal, well oriented, pleasant affect Breasts: the right breast is status post lumpectomy and radiation. There is no evidence of local recurrence. The right axilla is clear. The left breast is unremarkable.  LAB RESULTS: Patient has all her labwork done through her primary care giver. I do not find a copy forwarded to [place].  ASSESSMENT: 67 y.o.  [name], [name] woman status post right lumpectomy and sentinel lymph node biopsy December 2003 for a pT2 pN0, stage IIA  invasive ductal carcinoma, grade 3, estrogen receptor positive, progesterone receptor and HER2 negative, treated with radiation then anastrozolefor approximately 5 months.  The patient refused further antiestrogen treatment or standard screening with mammography.   PLAN: [name] continues to do remarkably well.  She has a very healthy diet and lifestyle. She still refuses mammography, but would agree to an MRI if that could be arranged. Unfortunately usually MRIs are denied unless a mammogram is obtained previously showing a need for an MRI. Nevertheless we will make the attempt.  She will make sure her primary caregiver copies [place] on her lab work. Otherwise she prefers to continue to be seen here on a once a year basis. She knows to call for any problems that may develop before that next visit. [name]    02/03/2013

## 2013-02-03 NOTE — Telephone Encounter (Signed)
Lm gv appt for 02/04/14 @ 2pm. Pt is aware that cs will call her w/ an appt for her MR. i also made the pt aware that i will mail a letter/avs...td

## 2013-02-05 ENCOUNTER — Other Ambulatory Visit: Payer: Self-pay | Admitting: *Deleted

## 2013-02-05 DIAGNOSIS — Z853 Personal history of malignant neoplasm of breast: Secondary | ICD-10-CM

## 2013-02-05 NOTE — Progress Notes (Signed)
This RN spoke with pt per MRI not allowed without obtaining mammgogram for surveillance of history of breast cancer.  Per discussion pt states understanding with plan to proceed to U/S .  Order placed and sent to scheduling.

## 2013-02-10 ENCOUNTER — Telehealth: Payer: Self-pay | Admitting: Oncology

## 2013-02-10 NOTE — Telephone Encounter (Signed)
/  S/w pt re appt for [place] @ [facility] 9/ @ :am. Also per pt request lmonvm on home phone w/appt d/t/location and phone number and confirm appt next year for f/u. Checked w/desk nurse and due to pt has delcined mammo per 02/06/13 pof she cannot have mri-breast and order for US breast was entered. Per Val pt will not have mammo or mri the only appt needed is the US-breast. Per the comment in the order pt goes to [place] - appt scheduled at [facility].

## 2013-02-27 ENCOUNTER — Other Ambulatory Visit: Payer: Self-pay | Admitting: Obstetrics and Gynecology

## 2013-03-01 LAB — URINE CULTURE

## 2013-03-09 ENCOUNTER — Encounter (INDEPENDENT_AMBULATORY_CARE_PROVIDER_SITE_OTHER): Payer: Self-pay

## 2014-02-03 ENCOUNTER — Telehealth: Payer: Self-pay | Admitting: Oncology

## 2014-02-03 NOTE — Telephone Encounter (Signed)
pt cld wanted to r/s appt-gave pt new time & date

## 2014-02-04 ENCOUNTER — Ambulatory Visit: Payer: Medicare Other | Admitting: Oncology

## 2014-04-13 ENCOUNTER — Ambulatory Visit (HOSPITAL_BASED_OUTPATIENT_CLINIC_OR_DEPARTMENT_OTHER): Payer: Medicare Other | Admitting: Oncology

## 2014-04-13 VITALS — BP 140/90 | HR 90 | Temp 97.6°F | Resp 18 | Ht 69.0 in | Wt 161.7 lb

## 2014-04-13 DIAGNOSIS — Z853 Personal history of malignant neoplasm of breast: Secondary | ICD-10-CM

## 2014-04-13 DIAGNOSIS — C50911 Malignant neoplasm of unspecified site of right female breast: Secondary | ICD-10-CM

## 2014-04-13 NOTE — Progress Notes (Signed)
ID: Shevonne Wolf Josey   DOB: 1946-01-16  MR#: [id number]  WUJ#:[id number]  PCP: Carroll Kinds, NP GYN: SU:  OTHER MD:   INTERVAL HISTORY: Cloa returns today for follow-up of her breast cancer. Interval history is generally unremarkable except that she has been under a great deal of stress, trying to sell her house in [place]. Apparently she has a good offer at present but it has been in a lot of work. Also her husband has started a new job with a which he is not entirely satisfied  REVIEW OF SYSTEMS: She describes herself as mildly fatigued, but remains very active. She exercises regularly, especially by [facility] [facility] [employer] [facility] [place]. She has mild sinus problems. She is concerned about weight gain. Otherwise a detailed review of systems today was noncontributory  PAST MEDICAL HISTORY: Past Medical History  Diagnosis Date  . Breast tumor   . Nose trouble   . Thyroid disease   . Hernia   . Cancer   . Irregular heart rate 2011  . Atrial fib/flutter, transient 2011  . HTN (hypertension)     PAST SURGICAL HISTORY: Past Surgical History  Procedure Laterality Date  . Breast tumor  2004  . Tonsillectomy  1953  . Reconstruction of nose  1985  . Tubal ligation      FAMILY HISTORY Family History  Problem Relation Age of Onset  . Stroke Mother   . Heart attack Father     SOCIAL HISTORY: Married to [employer] and moved to [place]. Patient however has kept her name.   ADVANCED DIRECTIVES: In place  HEALTH MAINTENANCE: History  Substance Use Topics  . Smoking status: Never Smoker   . Smokeless tobacco: Not on file  . Alcohol Use: 0.6 oz/week    1 Glasses of wine per week     Comment: 1-2 glasses daily      Colonoscopy: Remote; refuses repeat  PAP:  Bone density:  Lipid panel:  Allergies  Allergen Reactions  . Penicillins   . Sulfonamide Derivatives     Current Outpatient Prescriptions  Medication Sig Dispense Refill  . Flaxseed, Linseed,  (FLAXSEED OIL PO) Take by mouth daily.      . Omega-3 Fatty Acids (EQL OMEGA 3 FISH OIL PO) Take by mouth daily.       No current facility-administered medications for this visit.    OBJECTIVE: Middle-aged white woman who appears well Filed Vitals:   04/13/14 1341  BP: 140/90  Pulse: 90  Temp: 97.6 F (36.4 C)  Resp: 18     Body mass index is 23.87 kg/(m^2).    ECOG FS: 0  Sclerae unicteric, pupils equal and reactive Oropharynx clear and moist No cervical or supraclavicular adenopathy Lungs no rales or rhonchi Heart regular rate and rhythm Abd soft, nontender, positive bowel sounds MSK no focal spinal tenderness, no upper extremity lymphedema Neuro: nonfocal, well oriented, appropriate affect Breasts: right breast status post lumpectomy and radiation; no evidence of local recurrence. The right axilla is clear. The left breast is unremarkable.  LAB RESULTS: Patient has all her labwork done through her primary care giver.a copy is separately scanned. It shows a creatinine of 0.81, normal liver function tests, unremarkable electrolytes, a high vitamin D level, a normal C-reactive protein, white cell count 5.5, hemoglobin 14.6, platelets 297,000, with an unremarkable differential.  ASSESSMENT: 68 y.o.  [name], [place] woman status post right lumpectomy and sentinel lymph node biopsy December 2003 for a pT2 pN0, stage IIA  invasive ductal carcinoma, grade 3, estrogen receptor positive, progesterone receptor and HER2 negative, treated with radiation then anastrozolefor approximately 5 months.  The patient refused further antiestrogen treatment or standard screening with mammography.   PLAN:  Kalani is doing well, now 12 years out from her definitive surgery for stage II invasive ductal carcinoma which was estrogen receptor positive. She never took systemic treatment aside from 5 months of anastrozole and she remains at risk of late recurrence. She refuses mammography. She understands that that  is the standard in terms of breast cancer screening and furthermore that she has fatty replaced breasts which are generally very easy to see through mammography. We have discussed all this previously and she is adamant that she does not want mammograms because of the radiation involved.  She is agreeable to ultrasound. There are minor irregularities in her breasts and I think ultrasound can be helpful although she understands that the false positive and false negative rate is much greater or ultrasound alone than for ultrasound after mammography. She may be exposing herself to unnecessary procedures and may still be missing an early recurrence or the early development of a new breast cancer in the contralateral breast.  She will see me again in one year. She gets her lab work through her primary care physician and she will bring me a copy at the time of the next visit.   Ordell Prichett C    04/13/2014

## 2014-04-14 NOTE — Addendum Note (Signed)
Addended by: Laureen Abrahams on: 04/14/2014 05:58 PM   Modules accepted: Medications

## 2014-04-15 ENCOUNTER — Telehealth: Payer: Self-pay | Admitting: Oncology

## 2014-04-15 NOTE — Telephone Encounter (Signed)
Pt called and confirm appt d/t for  Nov 2016.Marland Kitchen Pt will call [facility] to confirm that appt d/t.

## 2014-04-15 NOTE — Telephone Encounter (Signed)
Lm to confirm Nov 2016. Spoke w/ Ubaldo Glassing @ [facility] to set up Breast US for Nov. 6, 2015 @ 11am. Faxed [facility] the order.

## 2015-04-18 ENCOUNTER — Ambulatory Visit: Payer: Medicare Other | Admitting: Oncology

## 2015-04-19 ENCOUNTER — Ambulatory Visit: Payer: Medicare Other | Admitting: Oncology

## 2015-10-12 ENCOUNTER — Telehealth: Payer: Self-pay | Admitting: Oncology

## 2015-10-12 ENCOUNTER — Other Ambulatory Visit: Payer: Self-pay | Admitting: *Deleted

## 2015-10-12 NOTE — Telephone Encounter (Signed)
appt made per 5/3 pof. Letter/calendar sent by mail per pt

## 2015-11-15 ENCOUNTER — Other Ambulatory Visit: Payer: Self-pay

## 2015-12-06 NOTE — Progress Notes (Signed)
ID: [name]   DOB: 15-Jun-1945  MR#: [id number]  MKJ#:[id number]  PCP: Anna Jasmine, NP GYN: SU:  OTHER MD:   INTERVAL HISTORY: [name] returns today for follow-up of her remote breast cancer. She has extensive lab work through her primary provider and labs were not repeated today.  REVIEW OF SYSTEMS:  [name] and 70. She looks a picture of health. She lives [address] [place] in [place]. She drives to eat and 4 shopping. She had her husband to keep her garden. She is not exercising as regularly as she used to. He describes herself is mildly fatigued. She has a little bit of low back pain. She has had more sinus infections this year, for which she is using Xyzal. [name] squamous cell removed and continues to have her thyroid function closely watched. Overall though a detailed review of systems today was stable   PAST MEDICAL HISTORY: Past Medical History  Diagnosis Date  . Breast tumor   . Nose trouble   . Thyroid disease   . Hernia   . Cancer   . Irregular heart rate 2011  . Atrial fib/flutter, transient 2011  . HTN (hypertension)     PAST SURGICAL HISTORY: Past Surgical History  Procedure Laterality Date  . Breast tumor  2004  . Tonsillectomy  1953  . Reconstruction of nose  1985  . Tubal ligation      FAMILY HISTORY Family History  Problem Relation Age of Onset  . Stroke Mother   . Heart attack Father     SOCIAL HISTORY: Married to [employer] and moved to [place]. Patient however has kept her name.   ADVANCED DIRECTIVES: In place  HEALTH MAINTENANCE: Social History  Substance Use Topics  . Smoking status: Never Smoker   . Smokeless tobacco: Not on file  . Alcohol Use: 0.6 oz/week    1 Glasses of wine per week     Comment: 1-2 glasses daily      Colonoscopy: Remote; refuses repeat  PAP:  Bone density:  Lipid panel:  Allergies  Allergen Reactions  . Penicillins   . Sulfonamide Derivatives     Current Outpatient  Prescriptions  Medication Sig Dispense Refill  . Flaxseed, Linseed, (FLAXSEED OIL PO) Take by mouth daily.      . naproxen sodium (ANAPROX) 550 MG tablet     . Omega-3 Fatty Acids (EQL OMEGA 3 FISH OIL PO) Take by mouth daily.       No current facility-administered medications for this visit.    OBJECTIVE: Middle-aged white woman In no acute distress Filed Vitals:   12/07/15 1047  BP: 167/95  Pulse: 101  Temp: 98 F (36.7 Anthony)  Resp: 18     Body mass index is 23.32 kg/(m^2).    ECOG FS: 0  Sclerae unicteric, EOMs intact Oropharynx clear and moist No cervical or supraclavicular adenopathy Lungs no rales or rhonchi Heart regular rate and rhythm Abd soft, nontender, positive bowel sounds MSK no focal spinal tenderness, no upper extremity lymphedema Neuro: nonfocal, well oriented, appropriate affect Breasts: The right breast is status post lumpectomy and radiation. There is no evidence of local recurrence. The right axilla is benign The left breast is unremarkable   LAB RESULTS: I do not have a copy of the patient's outside lab results  STUDIES: [name] did have a mammogram this year through [facility]. She tells me the results were normal. These have not yet been scanned  ASSESSMENT: 70 y.o.  [name],  [place] woman status post right lumpectomy and sentinel lymph node biopsy December 2003 for a pT2 pN0, stage IIA  invasive ductal carcinoma, grade 3, estrogen receptor positive, progesterone receptor and HER2 negative, treated with radiation then anastrozolefor approximately 5 months.  The patient refused further antiestrogen treatment or standard screening with mammography.   PLAN:  [name] is now 70 years out from definitive surgery for her breast cancer with no evidence of disease recurrence. This is very favorable.  I am delighted that she agreed to have mammography, as this is the current standard of care for breast cancer screening. Of course is very gratifying that it showed no evidence  of recurrent or active disease.  She is going to continue to see me on a yearly basis, which is her preference. She will continue to have her lab work to her primary provider. She knows to call for any problems that may develop before her next visit here. [name]    12/07/2015

## 2015-12-07 ENCOUNTER — Ambulatory Visit (HOSPITAL_BASED_OUTPATIENT_CLINIC_OR_DEPARTMENT_OTHER): Payer: Medicare Other | Admitting: Oncology

## 2015-12-07 ENCOUNTER — Telehealth: Payer: Self-pay | Admitting: Oncology

## 2015-12-07 ENCOUNTER — Other Ambulatory Visit: Payer: Self-pay

## 2015-12-07 VITALS — BP 167/95 | HR 101 | Temp 98.0°F | Resp 18 | Ht 69.0 in | Wt 158.0 lb

## 2015-12-07 DIAGNOSIS — R5383 Other fatigue: Secondary | ICD-10-CM

## 2015-12-07 DIAGNOSIS — M545 Low back pain: Secondary | ICD-10-CM

## 2015-12-07 DIAGNOSIS — C50911 Malignant neoplasm of unspecified site of right female breast: Secondary | ICD-10-CM

## 2015-12-07 NOTE — Telephone Encounter (Signed)
appt made and avs printed °

## 2016-11-27 ENCOUNTER — Telehealth: Payer: Self-pay | Admitting: Oncology

## 2016-11-27 NOTE — Telephone Encounter (Signed)
Left a message on home machine about appointment changes

## 2016-12-05 ENCOUNTER — Encounter: Payer: Self-pay | Admitting: Adult Health

## 2016-12-05 ENCOUNTER — Ambulatory Visit (HOSPITAL_BASED_OUTPATIENT_CLINIC_OR_DEPARTMENT_OTHER): Payer: Medicare Other | Admitting: Adult Health

## 2016-12-05 ENCOUNTER — Ambulatory Visit: Payer: Self-pay | Admitting: Oncology

## 2016-12-05 VITALS — BP 176/84 | HR 83 | Temp 98.7°F | Resp 18 | Ht 69.0 in | Wt 159.2 lb

## 2016-12-05 DIAGNOSIS — Z853 Personal history of malignant neoplasm of breast: Secondary | ICD-10-CM

## 2016-12-05 DIAGNOSIS — C50911 Malignant neoplasm of unspecified site of right female breast: Secondary | ICD-10-CM

## 2016-12-05 DIAGNOSIS — Z170 Estrogen receptor positive status [ER+]: Principal | ICD-10-CM

## 2016-12-05 NOTE — Progress Notes (Signed)
CLINIC:  Survivorship   REASON FOR VISIT:  Routine follow-up for history of breast cancer.   BRIEF ONCOLOGIC HISTORY:   -status post right lumpectomy and sentinel lymph node biopsy December 2003 for a pT2 pN0, stage IIA  invasive ductal carcinoma, grade 3, estrogen receptor positive, progesterone receptor and HER2 negative, treated with radiation then anastrozole for approximately 5 months.  The patient refused further antiestrogen treatment or standard screening with mammography.   INTERVAL HISTORY:  Ms. Below presents to the Survivorship Clinic today for routine follow-up for her history of breast cancer.  Overall, she reports feeling quite well.  She prefers natural treatments and supplements and is doing that in regards to health maitnenance.  She was seeing an International aid/development worker, who recently moved, so now she is looking for someone else who can see her in the area.  She had one mammogram last year.  She does the ultrasounds annually and her next one is in the next couple of weeks.  She exercises most days of the week.      REVIEW OF SYSTEMS:  Review of Systems  Constitutional: Negative for appetite change, chills, fatigue, fever and unexpected weight change.  HENT:   Negative for hearing loss and lump/mass.   Eyes: Negative for eye problems and icterus.  Respiratory: Negative for chest tightness, cough and shortness of breath.   Cardiovascular: Negative for chest pain, leg swelling and palpitations.  Gastrointestinal: Negative for abdominal distention and abdominal pain.  Endocrine: Negative for hot flashes.  Genitourinary: Negative for difficulty urinating and dyspareunia.   Musculoskeletal: Negative for arthralgias.  Skin: Negative for itching and rash.  Neurological: Negative for dizziness, extremity weakness, headaches and numbness.  Hematological: Negative for adenopathy. Does not bruise/bleed easily.  Psychiatric/Behavioral: Negative for depression. The  patient is not nervous/anxious.   Breast: Denies any new nodularity, masses, tenderness, nipple changes, or nipple discharge.       PAST MEDICAL/SURGICAL HISTORY:  Past Medical History:  Diagnosis Date  . Atrial fib/flutter, transient 2011  . Breast tumor   . Cancer   . Hernia   . HTN (hypertension)   . Irregular heart rate 2011  . Nose trouble   . Thyroid disease    Past Surgical History:  Procedure Laterality Date  . BREAST TUMOR  2004  . RECONSTRUCTION OF NOSE  1985  . TONSILLECTOMY  1953  . TUBAL LIGATION       ALLERGIES:  Allergies  Allergen Reactions  . Penicillins   . Sulfonamide Derivatives      CURRENT MEDICATIONS:  Outpatient Encounter Prescriptions as of 12/05/2016  Medication Sig Note  . Flaxseed, Linseed, (FLAXSEED OIL PO) Take by mouth daily.     . naproxen sodium (ANAPROX) 550 MG tablet  04/14/2014: Received from: External Pharmacy  . Omega-3 Fatty Acids (EQL OMEGA 3 FISH OIL PO) Take by mouth daily.      No facility-administered encounter medications on file as of 12/05/2016.      ONCOLOGIC FAMILY HISTORY:  Family History  Problem Relation Age of Onset  . Stroke Mother   . Heart attack Father     GENETIC COUNSELING/TESTING: Not indicated   SOCIAL HISTORY:  JANISA LABUS is married and lives with her husband in [place], [place].  She has 2 children and they live in [place] and [place], [place].  Ms. Demars is currently retired.  She denies any current or history of tobacco, alcohol, or illicit drug use.     PHYSICAL EXAMINATION:  Vital Signs: Vitals:   12/05/16 1424  BP: (!) 176/84  Pulse: 83  Resp: 18  Temp: 98.7 F (37.1 C)   Filed Weights   12/05/16 1424  Weight: 159 lb 3.2 oz (72.2 kg)   General: Well-nourished, well-appearing female in no acute distress.  Unaccompanied today.   HEENT: Head is normocephalic.  Pupils equal and reactive to light. Conjunctivae clear without exudate.  Sclerae anicteric. Oral mucosa is  pink, moist.  Oropharynx is pink without lesions or erythema.  Lymph: No cervical, supraclavicular, or infraclavicular lymphadenopathy noted on palpation.  Cardiovascular: Regular rate and rhythm.Marland Kitchen Respiratory: Clear to auscultation bilaterally. Chest expansion symmetric; breathing non-labored.  Breast Exam:  -Left breast: No appreciable masses on palpation. No skin redness, thickening, or peau d'orange appearance; no nipple retraction or nipple discharge;  -Right breast: No appreciable masses on palpation. No skin redness, thickening, or peau d'orange appearance; no nipple retraction or nipple discharge; mild distortion in symmetry at previous lumpectomy site well healed scar without erythema or nodularity. -Axilla: No axillary adenopathy bilaterally.  GI: Abdomen soft and round; non-tender, non-distended. Bowel sounds normoactive. No hepatosplenomegaly.   GU: Deferred.  Neuro: No focal deficits. Steady gait.  Psych: Mood and affect normal and appropriate for situation.  MSK: No focal spinal tenderness to palpation, full range of motion in bilateral upper extremities Extremities: No edema. Skin: Warm and dry.  LABORATORY DATA:  None for this visit   DIAGNOSTIC IMAGING:  Most recent ultrasound/mammogram-not in epic    ASSESSMENT AND PLAN:  Ms.. Sabine is a pleasant 71 y.o. female with history of Stage IIA right breast invasive ductal carcinoma, ER+/PR-/HER2-, diagnosed in 2003, treated with lumpectomy, adjuvant radiation therapy, and anti-estrogen therapy with Anastrozole x 5 months.  She presents to the Survivorship Clinic for surveillance and routine follow-up.   1. History of breast cancer:  Ms. Wilinski is currently clinically and radiographically without evidence of disease or recurrence of breast cancer. She participates in integrative medicine and undergoes mammogram every several years, and an ultrasound annually.  She is due for ultrasound in two weeks.  She will see Dr. Jana Hakim in  one year.  I encouraged her to call me with any questions or concerns before her next visit at the cancer center, and I would be happy to see her sooner, if needed.    2. Bone health:  Given Ms. Himes's age, history of breast cancer, she is at risk for bone demineralization.  She was given education on specific food and activities to promote bone health.  I will defer to her PCP regarding bone density management.   3. Cancer screening:  Due to Ms. Viles's history and her age, she should receive screening for skin cancers, colon cancer. She was encouraged to follow-up with her PCP for appropriate cancer screenings.   4. Health maintenance and wellness promotion: Ms. Morency was encouraged to consume 5-7 servings of fruits and vegetables per day. She was also encouraged to engage in moderate to vigorous exercise for 30 minutes per day most days of the week. She was instructed to limit her alcohol consumption and continue to abstain from tobacco use.    Dispo:  -Return to cancer center in one year for follow up with Dr. Jana Hakim   A total of (30) minutes of face-to-face time was spent with this patient with greater than 50% of that time in counseling and care-coordination.   Gardenia Phlegm, NP [facility] [phone]   Note: PRIMARY  CARE PROVIDER Rema Jasmine, NP [phone]

## 2016-12-20 ENCOUNTER — Ambulatory Visit: Payer: Self-pay | Admitting: Oncology

## 2016-12-26 ENCOUNTER — Encounter (HOSPITAL_BASED_OUTPATIENT_CLINIC_OR_DEPARTMENT_OTHER): Payer: Self-pay | Admitting: *Deleted

## 2016-12-31 ENCOUNTER — Ambulatory Visit (HOSPITAL_BASED_OUTPATIENT_CLINIC_OR_DEPARTMENT_OTHER)
Admission: RE | Admit: 2016-12-31 | Discharge: 2016-12-31 | Disposition: A | Discharge status: Home / Self Care | Payer: Medicare Other | Source: Ambulatory Visit | Attending: Specialist | Admitting: Specialist

## 2016-12-31 ENCOUNTER — Encounter (HOSPITAL_BASED_OUTPATIENT_CLINIC_OR_DEPARTMENT_OTHER)
Admission: RE | Disposition: A | Discharge status: Home / Self Care | Payer: Self-pay | Source: Ambulatory Visit | Attending: Specialist

## 2016-12-31 ENCOUNTER — Ambulatory Visit (HOSPITAL_BASED_OUTPATIENT_CLINIC_OR_DEPARTMENT_OTHER): Payer: Medicare Other | Admitting: Anesthesiology

## 2016-12-31 ENCOUNTER — Encounter (HOSPITAL_BASED_OUTPATIENT_CLINIC_OR_DEPARTMENT_OTHER): Payer: Self-pay | Admitting: Anesthesiology

## 2016-12-31 DIAGNOSIS — R2242 Localized swelling, mass and lump, left lower limb: Secondary | ICD-10-CM | POA: Diagnosis present

## 2016-12-31 DIAGNOSIS — D1724 Benign lipomatous neoplasm of skin and subcutaneous tissue of left leg: Secondary | ICD-10-CM | POA: Diagnosis not present

## 2016-12-31 DIAGNOSIS — Z791 Long term (current) use of non-steroidal anti-inflammatories (NSAID): Secondary | ICD-10-CM | POA: Diagnosis not present

## 2016-12-31 DIAGNOSIS — Z79899 Other long term (current) drug therapy: Secondary | ICD-10-CM | POA: Diagnosis not present

## 2016-12-31 HISTORY — PX: LIPOSUCTION: SHX10

## 2016-12-31 HISTORY — PX: EXCISION MASS LOWER EXTREMETIES: SHX6705

## 2016-12-31 SURGERY — EXCISION MASS LOWER EXTREMITIES
Anesthesia: General | Site: Thigh | Laterality: Left

## 2016-12-31 MED ORDER — ESMOLOL HCL 100 MG/10ML IV SOLN
INTRAVENOUS | Status: AC
Start: 2016-12-31 — End: 2016-12-31
  Filled 2016-12-31: qty 10

## 2016-12-31 MED ORDER — ONDANSETRON HCL 4 MG/2ML IJ SOLN
INTRAMUSCULAR | Status: DC | PRN
  Administered 2016-12-31: 4 mg via INTRAVENOUS

## 2016-12-31 MED ORDER — CHLORHEXIDINE GLUCONATE CLOTH 2 % EX PADS: 6.0000 | MEDICATED_PAD | Freq: Once | CUTANEOUS | Status: DC

## 2016-12-31 MED ORDER — LIDOCAINE 2% (20 MG/ML) 5 ML SYRINGE
INTRAMUSCULAR | Status: DC | PRN
  Administered 2016-12-31: 100 mg via INTRAVENOUS

## 2016-12-31 MED ORDER — SCOPOLAMINE 1 MG/3DAYS TD PT72: 1.0000 | MEDICATED_PATCH | Freq: Once | TRANSDERMAL | Status: DC | PRN

## 2016-12-31 MED ORDER — LIDOCAINE HCL (CARDIAC) 20 MG/ML IV SOLN
INTRAVENOUS | Status: AC
  Filled 2016-12-31: qty 5

## 2016-12-31 MED ORDER — LIDOCAINE HCL (PF) 2 % IJ SOLN
INTRAMUSCULAR | Status: DC | PRN
  Administered 2016-12-31: 50 mL via INTRADERMAL

## 2016-12-31 MED ORDER — PROPOFOL 500 MG/50ML IV EMUL
INTRAVENOUS | Status: AC
  Filled 2016-12-31: qty 50

## 2016-12-31 MED ORDER — ONDANSETRON HCL 4 MG/2ML IJ SOLN: 4.0000 mg | Freq: Once | INTRAMUSCULAR | Status: DC | PRN

## 2016-12-31 MED ORDER — PROPOFOL 10 MG/ML IV BOLUS
INTRAVENOUS | Status: DC | PRN
  Administered 2016-12-31: 50 mg via INTRAVENOUS
  Administered 2016-12-31: 150 mg via INTRAVENOUS

## 2016-12-31 MED ORDER — FENTANYL CITRATE (PF) 100 MCG/2ML IJ SOLN
25.0000 ug | INTRAMUSCULAR | Status: DC | PRN
  Administered 2016-12-31: 25 ug via INTRAVENOUS

## 2016-12-31 MED ORDER — EPINEPHRINE PF 1 MG/ML IJ SOLN
INTRAMUSCULAR | Status: DC | PRN
  Administered 2016-12-31: .5 mL

## 2016-12-31 MED ORDER — ONDANSETRON HCL 4 MG/2ML IJ SOLN
INTRAMUSCULAR | Status: AC
  Filled 2016-12-31: qty 2

## 2016-12-31 MED ORDER — LACTATED RINGERS IV SOLN
INTRAVENOUS | Status: DC
  Administered 2016-12-31: 07:00:00 via INTRAVENOUS

## 2016-12-31 MED ORDER — CEFAZOLIN SODIUM-DEXTROSE 2-4 GM/100ML-% IV SOLN
2.0000 g | INTRAVENOUS | Status: AC
  Administered 2016-12-31: 2 g via INTRAVENOUS

## 2016-12-31 MED ORDER — SODIUM BICARBONATE 4 % IV SOLN
INTRAVENOUS | Status: DC | PRN
  Administered 2016-12-31: 2.5 mL

## 2016-12-31 MED ORDER — LACTATED RINGERS IV SOLN
INTRAVENOUS | Status: AC | PRN
  Administered 2016-12-31: 500 mL via INTRAVENOUS

## 2016-12-31 MED ORDER — MIDAZOLAM HCL 2 MG/2ML IJ SOLN: 1.0000 mg | INTRAMUSCULAR | Status: DC | PRN

## 2016-12-31 MED ORDER — FENTANYL CITRATE (PF) 100 MCG/2ML IJ SOLN
50.0000 ug | INTRAMUSCULAR | Status: DC | PRN
  Administered 2016-12-31 (×2): 50 ug via INTRAVENOUS

## 2016-12-31 MED ORDER — FENTANYL CITRATE (PF) 100 MCG/2ML IJ SOLN
INTRAMUSCULAR | Status: AC
  Filled 2016-12-31: qty 2

## 2016-12-31 MED ORDER — CEFAZOLIN SODIUM-DEXTROSE 2-4 GM/100ML-% IV SOLN
INTRAVENOUS | Status: AC
  Filled 2016-12-31: qty 100

## 2016-12-31 MED ORDER — DEXAMETHASONE SODIUM PHOSPHATE 4 MG/ML IJ SOLN
INTRAMUSCULAR | Status: DC | PRN
  Administered 2016-12-31: 10 mg via INTRAVENOUS

## 2016-12-31 MED ORDER — EPINEPHRINE 30 MG/30ML IJ SOLN
INTRAMUSCULAR | Status: AC
  Filled 2016-12-31: qty 1

## 2016-12-31 MED ORDER — DEXAMETHASONE SODIUM PHOSPHATE 10 MG/ML IJ SOLN
INTRAMUSCULAR | Status: AC
  Filled 2016-12-31: qty 1

## 2016-12-31 MED ORDER — LIDOCAINE HCL 2 % IJ SOLN
INTRAMUSCULAR | Status: AC
  Filled 2016-12-31: qty 100

## 2016-12-31 MED ORDER — LIDOCAINE-EPINEPHRINE 0.5 %-1:200000 IJ SOLN
INTRAMUSCULAR | Status: AC
  Filled 2016-12-31: qty 2

## 2016-12-31 MED ORDER — CEFAZOLIN SODIUM-DEXTROSE 2-4 GM/100ML-% IV SOLN: 2.0000 g | INTRAVENOUS | Status: DC

## 2016-12-31 MED ORDER — SODIUM BICARBONATE 4 % IV SOLN
INTRAVENOUS | Status: AC
  Filled 2016-12-31: qty 5

## 2016-12-31 SURGICAL SUPPLY — 84 items
APL SKNCLS STERI-STRIP NONHPOA (GAUZE/BANDAGES/DRESSINGS)
BAG DECANTER FOR FLEXI CONT (MISCELLANEOUS) ×1 IMPLANT
BALL CTTN LRG ABS STRL LF (GAUZE/BANDAGES/DRESSINGS)
BANDAGE ACE 4X5 VEL STRL LF (GAUZE/BANDAGES/DRESSINGS) IMPLANT
BENZOIN TINCTURE PRP APPL 2/3 (GAUZE/BANDAGES/DRESSINGS) IMPLANT
BLADE KNIFE PERSONA 10 (BLADE) ×1 IMPLANT
BLADE KNIFE PERSONA 15 (BLADE) ×3 IMPLANT
BLADE SURG 11 STRL SS (BLADE) ×1 IMPLANT
BNDG COHESIVE 4X5 TAN STRL (GAUZE/BANDAGES/DRESSINGS) IMPLANT
BNDG GAUZE ELAST 4 BULKY (GAUZE/BANDAGES/DRESSINGS) IMPLANT
CANISTER SUCT 1200ML W/VALVE (MISCELLANEOUS) IMPLANT
CLEANER CAUTERY TIP 5X5 PAD (MISCELLANEOUS) IMPLANT
CLOSURE SKIN 1/8X3 (GAUZE/BANDAGES/DRESSINGS)
CLOSURE WOUND 1/2 X4 (GAUZE/BANDAGES/DRESSINGS)
COTTONBALL LRG STERILE PKG (GAUZE/BANDAGES/DRESSINGS) IMPLANT
COVER BACK TABLE 60X90IN (DRAPES) ×3 IMPLANT
COVER MAYO STAND STRL (DRAPES) ×3 IMPLANT
DECANTER SPIKE VIAL GLASS SM (MISCELLANEOUS) IMPLANT
DRAPE LAPAROTOMY 100X72 PEDS (DRAPES) ×2 IMPLANT
DRAPE U-SHAPE 76X120 STRL (DRAPES) IMPLANT
DRSG PAD ABDOMINAL 8X10 ST (GAUZE/BANDAGES/DRESSINGS) ×3 IMPLANT
ELECT REM PT RETURN 9FT ADLT (ELECTROSURGICAL) ×3
ELECTRODE REM PT RTRN 9FT ADLT (ELECTROSURGICAL) ×1 IMPLANT
FILTER LIPOSUCTION (MISCELLANEOUS) ×3 IMPLANT
GAUZE SPONGE 4X4 12PLY STRL (GAUZE/BANDAGES/DRESSINGS) ×3 IMPLANT
GAUZE SPONGE 4X4 12PLY STRL LF (GAUZE/BANDAGES/DRESSINGS) ×2 IMPLANT
GAUZE XEROFORM 1X8 LF (GAUZE/BANDAGES/DRESSINGS) ×1 IMPLANT
GAUZE XEROFORM 5X9 LF (GAUZE/BANDAGES/DRESSINGS) ×2 IMPLANT
GLOVE BIO SURGEON STRL SZ 6.5 (GLOVE) ×1 IMPLANT
GLOVE BIO SURGEONS STRL SZ 6.5 (GLOVE)
GLOVE BIOGEL M STRL SZ7.5 (GLOVE) ×5 IMPLANT
GLOVE BIOGEL PI IND STRL 8 (GLOVE) ×1 IMPLANT
GLOVE BIOGEL PI INDICATOR 8 (GLOVE) ×2
GLOVE ECLIPSE 7.0 STRL STRAW (GLOVE) ×3 IMPLANT
GOWN STRL REUS W/ TWL LRG LVL3 (GOWN DISPOSABLE) IMPLANT
GOWN STRL REUS W/ TWL XL LVL3 (GOWN DISPOSABLE) ×2 IMPLANT
GOWN STRL REUS W/TWL LRG LVL3 (GOWN DISPOSABLE)
GOWN STRL REUS W/TWL XL LVL3 (GOWN DISPOSABLE) ×6
IV LACTATED RINGERS 1000ML (IV SOLUTION) IMPLANT
IV NS 500ML (IV SOLUTION)
IV NS 500ML BAXH (IV SOLUTION) IMPLANT
LINER CANISTER 1000CC FLEX (MISCELLANEOUS) ×3 IMPLANT
MARKER SKIN DUAL TIP RULER LAB (MISCELLANEOUS) ×1 IMPLANT
NDL HYPO 25X1 1.5 SAFETY (NEEDLE) ×1 IMPLANT
NDL SAFETY ECLIPSE 18X1.5 (NEEDLE) IMPLANT
NDL SPNL 18GX3.5 QUINCKE PK (NEEDLE) ×1 IMPLANT
NEEDLE HYPO 18GX1.5 SHARP (NEEDLE)
NEEDLE HYPO 25X1 1.5 SAFETY (NEEDLE) ×3 IMPLANT
NEEDLE SPNL 18GX3.5 QUINCKE PK (NEEDLE) ×3 IMPLANT
PACK BASIN DAY SURGERY FS (CUSTOM PROCEDURE TRAY) ×3 IMPLANT
PAD CLEANER CAUTERY TIP 5X5 (MISCELLANEOUS)
PEN SKIN MARKING BROAD TIP (MISCELLANEOUS) ×1 IMPLANT
PENCIL BUTTON HOLSTER BLD 10FT (ELECTRODE) IMPLANT
SHEET MEDIUM DRAPE 40X70 STRL (DRAPES) ×3 IMPLANT
SHEETING SILICONE GEL EPI DERM (MISCELLANEOUS) IMPLANT
SLEEVE SCD COMPRESS KNEE MED (MISCELLANEOUS) ×3 IMPLANT
SPONGE LAP 18X18 X RAY DECT (DISPOSABLE) ×3 IMPLANT
STAPLER VISISTAT 35W (STAPLE) IMPLANT
STOCKINETTE 4X48 STRL (DRAPES) IMPLANT
STOCKINETTE IMPERVIOUS LG (DRAPES) IMPLANT
STRIP CLOSURE SKIN 1/2X4 (GAUZE/BANDAGES/DRESSINGS) IMPLANT
STRIP CLOSURE SKIN 1/8X3 (GAUZE/BANDAGES/DRESSINGS) IMPLANT
STRIP SUTURE WOUND CLOSURE 1/2 (SUTURE) IMPLANT
SUCTION FRAZIER HANDLE 10FR (MISCELLANEOUS)
SUCTION TUBE FRAZIER 10FR DISP (MISCELLANEOUS) IMPLANT
SUT ETHILON 3 0 PS 1 (SUTURE) IMPLANT
SUT MNCRL AB 3-0 PS2 18 (SUTURE) IMPLANT
SUT MON AB 2-0 CT1 36 (SUTURE) IMPLANT
SUT MON AB 5-0 PS2 18 (SUTURE) IMPLANT
SUT PROLENE 4 0 P 3 18 (SUTURE) IMPLANT
SUT PROLENE 4 0 PS 2 18 (SUTURE) IMPLANT
SUT VIC AB 0 CT1 27 (SUTURE)
SUT VIC AB 0 CT1 27XBRD ANBCTR (SUTURE) IMPLANT
SYR 20CC LL (SYRINGE) IMPLANT
SYR 50ML LL SCALE MARK (SYRINGE) ×6 IMPLANT
SYR CONTROL 10ML LL (SYRINGE) ×3 IMPLANT
TOWEL OR 17X24 6PK STRL BLUE (TOWEL DISPOSABLE) ×6 IMPLANT
TRAY DSU PREP LF (CUSTOM PROCEDURE TRAY) ×3 IMPLANT
TUBE CONNECTING 20'X1/4 (TUBING)
TUBE CONNECTING 20X1/4 (TUBING) IMPLANT
TUBING INFILTRATION IT-10001 (TUBING) IMPLANT
TUBING SET GRADUATE ASPIR 12FT (MISCELLANEOUS) ×3 IMPLANT
UNDERPAD 30X30 (UNDERPADS AND DIAPERS) ×3 IMPLANT
VAC PENCILS W/TUBING CLEAR (MISCELLANEOUS) IMPLANT

## 2016-12-31 NOTE — Anesthesia Postprocedure Evaluation (Signed)
Anesthesia Post Note  Patient: [name]  Procedure(s) Performed: Procedure(s) (LRB): EXCISION MASS OF LEFT THIGH (Left) LIPOSUCTION ASSISTANCE (Left)     Patient location during evaluation: PACU Anesthesia Type: General Level of consciousness: awake and alert Pain management: pain level controlled Vital Signs Assessment: post-procedure vital signs reviewed and stable Respiratory status: spontaneous breathing, nonlabored ventilation and respiratory function stable Cardiovascular status: blood pressure returned to baseline and stable Postop Assessment: no signs of nausea or vomiting Anesthetic complications: no    Last Vitals:  Vitals:   12/31/16 0900 12/31/16 1000  BP: (!) 156/90 (!) 144/91  Pulse: 82 76  Resp: 15 20  Temp:  (!) 36.4 C    Last Pain:  Vitals:   12/31/16 1000  TempSrc:   PainSc: [place]

## 2016-12-31 NOTE — Discharge Instructions (Signed)
°  Activity As tolerated: NO showers NO driving No heavy activities  Diet:regular No restrictions:  Wound Care: Keep dressing clean & dry  Do not change dressings  Call Doctor if any unusual problems occur such as pain, excessive Bleeding, unrelieved Nausea/vomiting, Fever &/or chills  Follow-up appointment: Please call the office.  The patient received discharge instruction from:___________________________________________    Post Anesthesia Home Care Instructions  Activity: Get plenty of rest for the remainder of the day. A responsible individual must stay with you for 24 hours following the procedure.  For the next 24 hours, DO NOT: -Drive a car -Paediatric nurse -Drink alcoholic beverages -Take any medication unless instructed by your physician -Make any legal decisions or sign important papers.  Meals: Start with liquid foods such as gelatin or soup. Progress to regular foods as tolerated. Avoid greasy, spicy, heavy foods. If nausea and/or vomiting occur, drink only clear liquids until the nausea and/or vomiting subsides. Call your physician if vomiting continues.  Special Instructions/Symptoms: Your throat may feel dry or sore from the anesthesia or the breathing tube placed in your throat during surgery. If this causes discomfort, gargle with warm salt water. The discomfort should disappear within 24 hours.  If you had a scopolamine patch placed behind your ear for the management of post- operative nausea and/or vomiting:  1. The medication in the patch is effective for 72 hours, after which it should be removed.  Wrap patch in a tissue and discard in the trash. Wash hands thoroughly with soap and water. 2. You may remove the patch earlier than 72 hours if you experience unpleasant side effects which may include dry mouth, dizziness or visual disturbances. 3. Avoid touching the patch. Wash your hands with soap and water after contact with the patch.

## 2016-12-31 NOTE — H&P (Signed)
[name] is an 71 y.o. female.   Chief Complaint:Enlarging mass left thigh HPI: Increased growth  Past Medical History:  Diagnosis Date  . Atrial fib/flutter, transient 2011  . Breast tumor   . Cancer ([place])   . Complication of anesthesia    pt says she is very difficult to wake up  . Hernia   . Irregular heart rate 2011  . Nose trouble   . Thyroid disease     Past Surgical History:  Procedure Laterality Date  . BREAST TUMOR  2004  . RECONSTRUCTION OF NOSE  1985  . TONSILLECTOMY  1953  . TUBAL LIGATION      Family History  Problem Relation Age of Onset  . Stroke Mother   . Heart attack Father    Social History:  reports that she has never smoked. She has never used smokeless tobacco. She reports that she drinks about 0.6 oz of alcohol per week . She reports that she does not use drugs.  Allergies:  Allergies  Allergen Reactions  . Penicillins   . Sulfonamide Derivatives     Medications Prior to Admission  Medication Sig Dispense Refill  . Omega-3 Fatty Acids (EQL OMEGA 3 FISH OIL PO) Take by mouth daily.      . Flaxseed, Linseed, (FLAXSEED OIL PO) Take by mouth daily.      . naproxen sodium (ANAPROX) 550 MG tablet       No results found for this or any previous visit (from the past 48 hour(s)). No results found.  Review of Systems  Constitutional: Negative.   HENT: Negative.   Eyes: Negative.   Respiratory: Negative.   Cardiovascular: Negative.   Gastrointestinal: Negative.   Genitourinary: Negative.   Musculoskeletal: Negative.   Skin: Negative.   Neurological: Negative.   Endo/Heme/Allergies: Negative.   Psychiatric/Behavioral: Negative.   All other systems reviewed and are negative.   Blood pressure (!) 168/58, pulse 86, temperature 98.4 F (36.9 C), temperature source Oral, resp. rate 18, height 5\' 9"  (1.753 m), weight 69.4 kg (153 lb), SpO2 100 %. Physical Exam   Assessment/Plan Mass left thigh for excision lipo  assistance  [name] L, MD 12/31/2016, 7:04 AM

## 2016-12-31 NOTE — Transfer of Care (Signed)
Immediate Anesthesia Transfer of Care Note  Patient: [name]  Procedure(s) Performed: Procedure(s): EXCISION MASS OF LEFT THIGH (Left) LIPOSUCTION ASSISTANCE (Left)  Patient Location: PACU  Anesthesia Type:General  Level of Consciousness: awake and sedated  Airway & Oxygen Therapy: Patient Spontanous Breathing  Post-op Assessment: Report given to RN and Post -op Vital signs reviewed and stable  Post vital signs: Reviewed and stable  Last Vitals:  Vitals:   12/31/16 0654 12/31/16 0704  BP: (!) 168/58   Pulse: 86   Resp: 18   Temp: 36.9 C 36.9 C    Last Pain:  Vitals:   12/31/16 0704  TempSrc: Oral         Complications: No apparent anesthesia complications

## 2016-12-31 NOTE — Brief Op Note (Signed)
12/31/2016  8:01 AM  PATIENT:  Sheryle Spray Aschenbrenner  71 y.o. female  PRE-OPERATIVE DIAGNOSIS:  MASS OF LEFT THIGH  POST-OPERATIVE DIAGNOSIS:  MASS OF LEFT THIGH  PROCEDURE:  Procedure(s): EXCISION MASS OF LEFT THIGH (Left) LIPOSUCTION ASSISTANCE (Left)  SURGEON:  Surgeon(s) and Role:    * Cristine Polio, MD - Primary  PHYSICIAN ASSISTANT:   ASSISTANTS: none   ANESTHESIA:   general  EBL:  Total I/O In: 800 [I.V.:800] Out: 5 [Blood:5]  BLOOD ADMINISTERED:none  DRAINS: none   LOCAL MEDICATIONS USED:  LIDOCAINE   SPECIMEN:  Excision  DISPOSITION OF SPECIMEN:  PATHOLOGY  COUNTS:  YES  TOURNIQUET:  * No tourniquets in log *  DICTATION: .Other Dictation: Dictation Number [id number]  PLAN OF CARE: Discharge to home after PACU  PATIENT DISPOSITION:  PACU - hemodynamically stable.   Delay start of Pharmacological VTE agent (>24hrs) due to surgical blood loss or risk of bleeding: yes

## 2016-12-31 NOTE — Anesthesia Preprocedure Evaluation (Addendum)
Anesthesia Evaluation  Patient identified by MRN, date of birth, ID band Patient awake    Reviewed: Allergy & Precautions, NPO status , Patient's Chart, lab work & pertinent test results  History of Anesthesia Complications (+) history of anesthetic complications ("difficult to wake up")  Airway Mallampati: I  TM Distance: <3 FB Neck ROM: Full    Dental  (+) Teeth Intact, Dental Advisory Given, Implants,    Pulmonary neg pulmonary ROS,    Pulmonary exam normal breath sounds clear to auscultation       Cardiovascular Normal cardiovascular exam+ dysrhythmias Atrial Fibrillation  Rhythm:Regular Rate:Normal     Neuro/Psych negative neurological ROS  negative psych ROS   GI/Hepatic negative GI ROS, Neg liver ROS,   Endo/Other  Hypothyroidism   Renal/GU negative Renal ROS     Musculoskeletal negative musculoskeletal ROS (+)   Abdominal   Peds  Hematology negative hematology ROS (+)   Anesthesia Other Findings Day of surgery medications reviewed with the patient.  Reproductive/Obstetrics                            Anesthesia Physical Anesthesia Plan  ASA: II  Anesthesia Plan: General   Post-op Pain Management:    Induction: Intravenous  PONV Risk Score and Plan: 3 and Ondansetron, Dexamethasone and Midazolam  Airway Management Planned: LMA  Additional Equipment:   Intra-op Plan:   Post-operative Plan: Extubation in OR  Informed Consent: I have reviewed the patients History and Physical, chart, labs and discussed the procedure including the risks, benefits and alternatives for the proposed anesthesia with the patient or authorized representative who has indicated his/her understanding and acceptance.   Dental advisory given  Plan Discussed with: CRNA  Anesthesia Plan Comments: (Risks/benefits of general anesthesia discussed with patient including risk of damage to teeth, lips,  gum, and tongue, nausea/vomiting, allergic reactions to medications, and the possibility of heart attack, stroke and death.  All patient questions answered.  Patient wishes to proceed.)       Anesthesia Quick Evaluation

## 2016-12-31 NOTE — Op Note (Signed)
NAMESHAUNTE, TUFT              ACCOUNT NO.:  [id number]  MEDICAL RECORD NO.:  [id number]  LOCATION:                                 FACILITY:  PHYSICIAN:  Berneta Sages L. Towanda Malkin, M.D.   DATE OF BIRTH:  DATE OF PROCEDURE:  12/31/2016 DATE OF DISCHARGE:                              OPERATIVE REPORT   INDICATION:  This patient is 71 years old, has a large mass involving the left lateral thigh area, increased growth and discomfort.  The patient states that the area has tripled in size over the last 5-6 months.  PROCEDURE DONE:  Excision of mass.  The pathology to be sent to rule out a lipoma versus liposarcoma.  DESCRIPTION OF PROCEDURE:  All procedures in detail were explained to the patient preoperatively.  All questions were answered.  The patient understands the procedure, indications, and understands no 100% guarantee of any results.  The patient underwent drawings of the area and then was taken to surgery.  She underwent general anesthesia, intubated orally.  The patient was placed in a right lateral decubitus position.  Prep was done to the left lateral thigh area with Hibiclens soap and solution walled off with sterile towels and drapes so as make a sterile field.  The areas were injected with tumescent.  A total of 400 mL was allowed to sit up and then liposuction was fashioned over the superior and medial portions of the mass, removing over 350 mL of fatty material.  This is to be sent to the laboratory for evaluation.  At the end of the procedure, the wounds were cleansed.  There was no closure done.  Sterile wraps were placed for drainage.  She withstood the procedures very well.  Estimated blood loss less than 10 mL. Complications none.     Odella Aquas. Towanda Malkin, M.D.   ______________________________ Odella Aquas. Towanda Malkin, M.D.    Elie Confer  D:  12/31/2016  T:  12/31/2016  Job:  [id number]

## 2016-12-31 NOTE — Anesthesia Procedure Notes (Signed)
Procedure Name: LMA Insertion Performed by: Crickett Abbett W Pre-anesthesia Checklist: Patient identified, Emergency Drugs available, Suction available and Patient being monitored Patient Re-evaluated:Patient Re-evaluated prior to induction Oxygen Delivery Method: Circle system utilized Preoxygenation: Pre-oxygenation with 100% oxygen Induction Type: IV induction Ventilation: Mask ventilation without difficulty LMA: LMA inserted LMA Size: 4.0 Number of attempts: 1 Placement Confirmation: positive ETCO2 Tube secured with: Tape Dental Injury: Teeth and Oropharynx as per pre-operative assessment        

## 2017-01-01 ENCOUNTER — Encounter (HOSPITAL_BASED_OUTPATIENT_CLINIC_OR_DEPARTMENT_OTHER): Payer: Self-pay | Admitting: Specialist

## 2017-01-08 ENCOUNTER — Encounter (HOSPITAL_BASED_OUTPATIENT_CLINIC_OR_DEPARTMENT_OTHER): Payer: Self-pay | Admitting: Specialist

## 2017-02-17 ENCOUNTER — Encounter: Payer: Self-pay | Admitting: Cardiology

## 2017-02-17 NOTE — Progress Notes (Deleted)
Cardiology Office Note  Date: 02/17/2017   ID: [name], DOB 26-Oct-1945, MRN [id number]  PCP: Glenda Chroman, MD  Consulting Cardiologist: Rozann Lesches, MD   No chief complaint on file.   History of Present Illness: [name] is a 71 y.o. female referred for cardiology consultation by Dr. Woody Seller for the evaluation of atrial fibrillation. Record review finds prior cardiology evaluation with Dr. Stanford Breed and Dr. Caryl Comes, last seen in 2012. She has a history of atrial fibrillation, not followed by cardiology in several years. In general she has preferred a holistic approach to her health, has taken various supplements over the years, and declined prior recommendations for more traditional medical therapy to address arrhythmia including anticoagulation for stroke prophylaxis. It seems she had recurrent symptomatic atrial fibrillation in early August, managed through [facility] and also resulting in an ER visit at [facility].  Recent echocardiogram done through [facility] in August is summarized below.  I personally reviewed her ECG from 01/10/2017 which showed atrial fibrillation with diffuse nonspecific ST-T wave abnormalities.  CHADSVASC score is 3. Annual risk of stroke on no specific therapy is 3.2%, 3.4% on aspirin, and 1.4% on agent such as Xarelto.  Past Medical History:  Diagnosis Date  . Atrial fibrillation ([place]) 2011  . Breast cancer [facility]) 2003   Right lumpectomy and sentinel lymph node biopsy December 2003 for a pT2 pN0, stage IIA  invasive ductal carcinoma, grade 3  . Essential hypertension   . Thyroid disease     Past Surgical History:  Procedure Laterality Date  . BREAST TUMOR  2004  . EXCISION MASS LOWER EXTREMETIES Left 12/31/2016   Procedure: EXCISION MASS OF LEFT THIGH;  Surgeon: Cristine Polio, MD;  Location: [place];  Service: Plastics;  Laterality: Left;  . LIPOSUCTION Left 12/31/2016   Procedure: LIPOSUCTION ASSISTANCE;  Surgeon: Cristine Polio, MD;  Location: [place];  Service: Plastics;  Laterality: Left;  . RECONSTRUCTION OF NOSE  1985  . TONSILLECTOMY  1953  . TUBAL LIGATION      Current Outpatient Prescriptions  Medication Sig Dispense Refill  . Flaxseed, Linseed, (FLAXSEED OIL PO) Take by mouth daily.      . Omega-3 Fatty Acids (EQL OMEGA 3 FISH OIL PO) Take by mouth daily.       No current facility-administered medications for this visit.    Allergies:  Penicillins and Sulfonamide derivatives   Social History: The patient  reports that she has never smoked. She has never used smokeless tobacco. She reports that she drinks about 0.6 oz of alcohol per week . She reports that she does not use drugs.   Family History: The patient's family history includes Heart attack in her father; Stroke in her mother.   ROS:  Please see the history of present illness. Otherwise, complete review of systems is positive for {NONE DEFAULTED:18576::"none"}.  All other systems are reviewed and negative.   Physical Exam: VS:  There were no vitals taken for this visit., BMI There is no height or weight on file to calculate BMI.  Wt Readings from Last 3 Encounters:  12/31/16 158 lb (71.7 kg)  12/05/16 159 lb 3.2 oz (72.2 kg)  12/07/15 158 lb (71.7 kg)    General: Patient appears comfortable at rest. HEENT: Conjunctiva and lids normal, oropharynx clear with moist mucosa. Neck: Supple, no elevated JVP or carotid bruits, no thyromegaly. Lungs: Clear to auscultation, nonlabored breathing at rest.  Cardiac: Regular rate and rhythm, no S3 or significant systolic murmur, no pericardial rub. Abdomen: Soft, nontender, no hepatomegaly, bowel sounds present, no guarding or rebound. Extremities: No pitting edema, distal pulses 2+. Skin: Warm and dry. Musculoskeletal: No kyphosis. Neuropsychiatric: Alert and oriented x3, affect grossly appropriate.  ECG: I personally  reviewed the tracing from 07/18/2010 which showed atrial fibrillation with diffuse nonspecific ST/T abnormalities.  Recent Labwork:  January 2018: BUN 13, creatinine 0.69, potassium 4.2, AST 27, ALT 15, hemoglobin A1c 5.4, TSH 3.36  Other Studies Reviewed Today:  Echocardiogram 05/27/2008: SUMMARY - Overall left ventricular systolic function was normal. Left   ventricular ejection fraction was estimated to be 60 %. There   were no left ventricular regional wall motion abnormalities.   Left ventricular diastolic function parameters were normal. - Estimated peak pulmonary artery systolic pressure was in the   range of 34 mmHg to 38 mmHg. - The right atrium was mildly dilated. The Eustachian valve   appeared prominent. - IVC measures 2.2 cm with greater than 50% respirophasic   variation, suggesting RA pressure 6-10 mmHg.  IMPRESSIONS - Normal LV and RV size and function. - Mild mitral regurgitation. - Pulmonary artery pressure borderline elevated. - Prominent eustachian valve in RA.  Echocardiogram 01/15/2017 ([place]): LVEF 55-60%, mildly dilated left atrium, normal right ventricular contraction, mildly thickened aortic leaflets, trace mitral regurgitation, RVSP estimated 34 mmHg, no pericardial effusion.  Assessment and Plan:    Current medicines were reviewed with the patient today.  No orders of the defined types were placed in this encounter.   Disposition:  Signed, Satira Sark, MD, [facility] 02/17/2017 1:58 PM    [facility] [place], [place], [place] [address] Phone: [phone]; Fax: [phone]

## 2017-02-18 ENCOUNTER — Ambulatory Visit: Payer: Medicare Other | Admitting: Cardiology

## 2017-02-18 ENCOUNTER — Encounter: Payer: Self-pay | Admitting: Cardiology

## 2017-03-15 ENCOUNTER — Ambulatory Visit: Payer: Medicare Other | Admitting: Cardiology

## 2017-03-18 ENCOUNTER — Encounter: Payer: Self-pay | Admitting: Cardiology

## 2017-03-18 ENCOUNTER — Ambulatory Visit (INDEPENDENT_AMBULATORY_CARE_PROVIDER_SITE_OTHER): Payer: Medicare Other | Admitting: Cardiology

## 2017-03-18 VITALS — BP 182/99 | HR 149 | Ht 69.0 in | Wt 148.2 lb

## 2017-03-18 DIAGNOSIS — Z853 Personal history of malignant neoplasm of breast: Secondary | ICD-10-CM | POA: Diagnosis not present

## 2017-03-18 DIAGNOSIS — Z8679 Personal history of other diseases of the circulatory system: Secondary | ICD-10-CM | POA: Diagnosis not present

## 2017-03-18 DIAGNOSIS — I517 Cardiomegaly: Secondary | ICD-10-CM

## 2017-03-18 DIAGNOSIS — I480 Paroxysmal atrial fibrillation: Secondary | ICD-10-CM

## 2017-03-18 NOTE — Patient Instructions (Signed)
Medication Instructions:  Your physician recommends that you continue on your current medications as directed. Please refer to the Current Medication list given to you today.  Labwork: NONE  Testing/Procedures: NONE  Follow-Up: Your physician recommends that you schedule a follow-up appointment AS NEEDED  Any Other Special Instructions Will Be Listed Below (If Applicable).  If you need a refill on your cardiac medications before your next appointment, please call your pharmacy. 

## 2017-03-18 NOTE — Progress Notes (Signed)
Cardiology Office Note  Date: 03/18/2017   ID: [name], DOB 17-Dec-1945, MRN [id number]  PCP: Glenda Chroman, MD  Consulting Cardiologist: Rozann Lesches, MD   Chief Complaint  Patient presents with  . Atrial Fibrillation    History of Present Illness: [name] is a 71 y.o. female former patient of Dr. Stanford Breed and Dr. Caryl Comes, not seen since 2012. She is referred for cardiology consultation by Dr. Woody Seller for the evaluation of atrial fibrillation. She has a history of atrial fibrillation, not followed by cardiology in several years. In general she has preferred a holistic approach to her health, has taken various supplements over the years, and declined prior recommendations for more traditional medical therapy to address arrhythmia including anticoagulation for stroke prophylaxis. It seems she had recurrent symptomatic atrial fibrillation in early August, managed through [facility] and also resulting in an ER visit at [facility].  She presents today for evaluation. She tells me that she was worried about today's visit, generally does not like to take traditional medical therapy, follows with a holistic health nurse practitioner, and went through a lot of her prior frustrations with medical diagnoses and treatment over the years. She states that she has only an intermittent sense of palpitations, does not report any exertional chest pain. She has been tracking her blood pressure and heart rate at home, only notices occasional times where her monitor indicates an irregular heart beat. On balance blood pressure and heart rate has been well controlled.  I reviewed her medications. She is on a multitude of natural supplements and other agents, states that she is only taking Lopressor at 50 mg once in the morning. She is also on Xarelto but generally does not want to take a "blood thinner." I personally reviewed her ECG today which showed rapid atrial  fibrillation at 130 beats permit, diffuse nonspecific ST-T changes.  Recent echocardiogram done through [facility] in August is summarized below. I personally reviewed her ECG from 01/10/2017 which showed atrial fibrillation with diffuse nonspecific ST-T wave abnormalities.  Today we had a long and detailed discussion regarding natural history of atrial fibrillation, risk of stroke, and also management strategies to not only help symptomatology, but also reduce risk of cardiomyopathy with uncontrolled heart rates. We discussed general strategy of heart rate control and anticoagulation, also possibility of antiarrhythmic therapy or cardioversion, also possibility of ablation. CHADSVASC score is 3. Annual risk of stroke on no specific therapy is 3.2%, 3.4% on aspirin, and 1.4% on agent such as Xarelto.   Past Medical History:  Diagnosis Date  . Atrial fibrillation ([place]) 2011  . Breast cancer [facility]) 2003   Right lumpectomy and sentinel lymph node biopsy December 2003 for a pT2 pN0, stage IIA  invasive ductal carcinoma, grade 3  . Essential hypertension   . Thyroid disease     Past Surgical History:  Procedure Laterality Date  . BREAST TUMOR  2004  . EXCISION MASS LOWER EXTREMETIES Left 12/31/2016   Procedure: EXCISION MASS OF LEFT THIGH;  Surgeon: Cristine Polio, MD;  Location: [place];  Service: Plastics;  Laterality: Left;  . LIPOSUCTION Left 12/31/2016   Procedure: LIPOSUCTION ASSISTANCE;  Surgeon: Cristine Polio, MD;  Location: [place];  Service: Plastics;  Laterality: Left;  . RECONSTRUCTION OF NOSE  1985  . TONSILLECTOMY  1953  . TUBAL LIGATION      Current Outpatient Prescriptions  Medication Sig Dispense Refill  .  Acetylcysteine (NAC PO) Take 1 tablet by mouth daily. Blood Orange Complex / Selenium    . ALOE VERA PO Take 1 tablet by mouth daily.    . Ascorbic Acid (VITAMIN C PO) Take 1 tablet by mouth daily. Lysine    .  Calcium Carbonate-Vitamin D (CALCIUM-D PO) Take 1 tablet by mouth daily.    . Capsicum, Cayenne, (CAYENNE PO) Take 1 tablet by mouth daily.    . Cholecalciferol (VITAMIN D PO) Take 1 tablet by mouth daily.    . Coenzyme Q10 (COQ10 PO) Take 1 tablet by mouth daily.    . Digestive Enzymes (BETAINE HCL PO) Take 1 tablet by mouth daily. Digestion formula.    Marland Kitchen GARLIC PO Take 1 tablet by mouth daily. Fermented Black    . Green Tea, Camillia sinensis, (GREEN TEA PO) Take by mouth daily.    Marland Kitchen KRILL OIL PO Take 1 tablet by mouth daily.    . LevOCARNitine (CARNITINE PO) Take 1 tablet by mouth daily.    Marland Kitchen MAGNESIUM OXIDE PO Take 1 tablet by mouth daily.    . metoprolol tartrate (LOPRESSOR) 50 MG tablet Take 50 mg by mouth at bedtime.    Marland Kitchen MISC NATURAL PRODUCTS EX Apply topically. Bi-Est 80/20 cream - 5mg /ml - 7/6HY (2 clicks) twice a day    . MISC NATURAL PRODUCTS PO Take 1 tablet by mouth at bedtime. Naltrexone 4.5mg     . MISC NATURAL PRODUCTS PO Take 1 tablet by mouth daily. T3 T4 compound for thyroid    . MISC NATURAL PRODUCTS PO Take 1 tablet by mouth daily. MCT/DHA Collagen Keto Balance    . MISC NATURAL PRODUCTS PO Take 1 tablet by mouth daily. Amla Extract    . MISC NATURAL PRODUCTS PO Take 1 tablet by mouth daily. AMPK Metabolic Activator - Hesperidin    . MISC NATURAL PRODUCTS PO Take 1 tablet by mouth daily. Cruciferous Veggie Extract - Broccoli, 13C, DIM, etc..    . MISC NATURAL PRODUCTS PO Take 1 tablet by mouth daily. Omega 7    . MISC NATURAL PRODUCTS PO Take 1 tablet by mouth daily. Mushroom Complex    . Multiple Vitamin (MULTIVITAMIN) tablet Take 1 tablet by mouth daily. Whole food mineral complex.    Marland Kitchen NATTOKINASE PO Take 1 tablet by mouth daily.    . Nutritional Supplements (ADRENAL COMPLEX PO) Take 1 tablet by mouth daily.    . Nutritional Supplements (PYCNOGENOL PO) Take 1 tablet by mouth daily. & gotu Kola    . Omega-3 Fatty Acids (EQL OMEGA 3 FISH OIL PO) Take by mouth daily.        . Probiotic Product (PROBIOTIC DAILY PO) Take 1 tablet by mouth daily.    . Progesterone Micronized (PROGESTERONE PO) Take 1 tablet by mouth daily.    Marland Kitchen QUERCETIN PO Take 1 tablet by mouth daily.    . rivaroxaban (XARELTO) 20 MG TABS tablet Take 20 mg by mouth daily with supper.    . TURMERIC CURCUMIN PO Take 1 tablet by mouth daily.     No current facility-administered medications for this visit.    Allergies:  Penicillins and Sulfonamide derivatives   Social History: The patient  reports that she has never smoked. She has never used smokeless tobacco. She reports that she drinks about 0.6 oz of alcohol per week . She reports that she does not use drugs.   Family History: The patient's family history includes Heart attack in her father; Stroke in her  mother.   ROS:  Please see the history of present illness. Otherwise, complete review of systems is positive for occasional sense of palpitations. No exertional chest pain, reportedly NYHA class I dyspnea. No syncope.  All other systems are reviewed and negative.   Physical Exam: VS:  BP (!) 182/99   Pulse (!) 149   Ht 5\' 9"  (1.753 m)   Wt 148 lb 3.2 oz (67.2 kg)   SpO2 98%   BMI 21.89 kg/m , BMI Body mass index is 21.89 kg/m.  Wt Readings from Last 3 Encounters:  03/18/17 148 lb 3.2 oz (67.2 kg)  12/31/16 158 lb (71.7 kg)  12/05/16 159 lb 3.2 oz (72.2 kg)    General: Normally nourished appearing woman, no distress. HEENT: Conjunctiva and lids normal, oropharynx clear. Neck: Supple, no elevated JVP or carotid bruits, no thyromegaly. Lungs: Clear to auscultation, nonlabored breathing at rest. Cardiac: Irregularly irregular, no S3 or significant systolic murmur, no pericardial rub. Abdomen: Soft, nontender, bowel sounds present, no guarding or rebound. Extremities: No pitting edema, distal pulses 2+. Skin: Warm and dry. Musculoskeletal: No kyphosis. Neuropsychiatric: Alert and oriented x3, affect grossly appropriate.  ECG: I  personally reviewed the tracing from 07/18/2010 which showed atrial fibrillation with diffuse nonspecific ST/T abnormalities.  Recent Labwork:  January 2018: BUN 13, creatinine 0.69, potassium 4.2, AST 27, ALT 15, hemoglobin A1c 5.4, TSH 3.36  Other Studies Reviewed Today:  Echocardiogram 05/27/2008: SUMMARY - Overall left ventricular systolic function was normal. Left   ventricular ejection fraction was estimated to be 60 %. There   were no left ventricular regional wall motion abnormalities.   Left ventricular diastolic function parameters were normal. - Estimated peak pulmonary artery systolic pressure was in the   range of 34 mmHg to 38 mmHg. - The right atrium was mildly dilated. The Eustachian valve   appeared prominent. - IVC measures 2.2 cm with greater than 50% respirophasic   variation, suggesting RA pressure 6-10 mmHg.  IMPRESSIONS - Normal LV and RV size and function. - Mild mitral regurgitation. - Pulmonary artery pressure borderline elevated. - Prominent eustachian valve in RA.  Echocardiogram 01/15/2017 ([place]): LVEF 55-60%, mildly dilated left atrium, normal right ventricular contraction, mildly thickened aortic leaflets, trace mitral regurgitation, RVSP estimated 34 mmHg, no pericardial effusion.  Assessment and Plan:  Today we spent 45 minutes of face-to-face time including review of chart, outside records brought in by the patient, and full discussion of her present medical concerns.  1. Paroxysmal to persistent atrial fibrillation with CHADSVASC score of 3, noted to have RVR today, and reportedly without major symptomatology per patient. Today we had a long and detailed discussion as outlined above. After discussion it is fairly clear that Ms. Carpenito prefers not to take traditional medical therapy and infact tells me that her initial thought is to wean herself off metoprolol and stop Xarelto. We have discussed risk  of stroke and also not only worsening symptoms with atrial fibrillation, but also cardiomyopathy and congestive heart failure if this remains untreated and does not settle down otherwise. She will consider the matter further and if she would like to continue with medical therapy, she can plan to come back for follow-up. I have recommended that the most basic approach right now would be use of an agent for heart rate control, perhaps Toprol-XL as a once a day dose instead of Lopressor, and also anticoagulation, with Xarelto being a reasonable choice.  2. Patient denies long-standing history of hypertension.  She states her blood pressure has been fluctuating recently, and she largely attributes this to having an unusually high testosterone level due to supplements which has subsequently been corrected.  3. History of breast cancer status post right lumpectomy and previous radiation.  4. Current echocardiogram demonstrates preserved LVEF at 55-60% with mildly dilated left atrium.  Current medicines were reviewed with the patient today.  Disposition: Patient encouraged to follow-up if she would like to continue with medical therapy adjustments and management for treatment of atrial fibrillation. She will otherwise continue to follow with her holistic health nurse practitioner and with Dr. Woody Seller.  Signed, Satira Sark, MD, [facility] 03/18/2017 5:16 PM    [facility] [place], [place], [place] [address] Phone: [phone]; Fax: [phone]

## 2017-03-20 NOTE — Addendum Note (Signed)
Addended by: Laurine Blazer on: 03/20/2017 02:55 PM   Modules accepted: Orders

## 2017-03-22 ENCOUNTER — Telehealth: Payer: Self-pay | Admitting: Cardiology

## 2017-03-22 NOTE — Telephone Encounter (Signed)
patient calling to discuss medication Which way she wants to change the metro to the slow release instead of tartrate   [facility] in [place]

## 2017-03-25 NOTE — Telephone Encounter (Signed)
Patient would like to switch to Toprol XL 50 mg. Is this ok?

## 2017-03-25 NOTE — Telephone Encounter (Signed)
Please see my recent detailed consultation. I think switching to a long-acting beta blocker is reasonable, I would recommend that she get Dr. Woody Seller to make these adjustments since he will likely be following her medications most closely based on our recent discussion. Initiating Toprol-XL at 75 mg in the morning would be a good start, and this could be up titrated depending on how well her heart rate is controlled.

## 2017-03-26 NOTE — Telephone Encounter (Signed)
LMTCB

## 2017-03-26 NOTE — Telephone Encounter (Signed)
Patient notified. Dr. Chuck Hint recommendation routed to Dr. Woody Seller office.

## 2017-07-05 ENCOUNTER — Telehealth: Payer: Self-pay | Admitting: Oncology

## 2017-07-05 NOTE — Telephone Encounter (Signed)
Spoke with patient regarding rescheduling her 6/25 appointment to 6/18 per phone message from patient.

## 2017-08-15 ENCOUNTER — Encounter: Payer: Self-pay | Admitting: Oncology

## 2017-11-25 NOTE — Progress Notes (Signed)
ID: [name]   DOB: [birth year]  MR#: [id number]  WGN#:[id number]  Patient Care Team: Glenda Chroman, MD as PCP - General (Internal Medicine) Leo Grosser Seymour Bars, MD (Obstetrics and Gynecology) Jaydalee Bardwell, Virgie Dad, MD as Consulting Physician (Oncology) Glenda Chroman, MD as Referring Physician (Internal Medicine)     INTERVAL HISTORY: [name] returns today for follow-up of her remote breast cancer. She continues under observation. The interval history is generally unremarkable. She denies shooting pains or soreness in her right breast.    Bilateral breast ultrasound at [facility] on 01/31/2017 showed a 4 mm oval mass in the right upper outer breast posterior likely a complex cyst and probably benign.The patient declines further mammography.   REVIEW OF SYSTEMS:  [name] reports that she was diagnosed with atrial fibrillation, which also runs in her family. She follows up with [facility]. She is concerned about invasive procedures to treat her heart issues. She has some slight headaches from allergies.  For exercise, she is very active with outdoor activities. She also has chickens. [facility] Sometimes she is able to count her steps.  She denies unusual headaches, visual changes, nausea, vomiting, or dizziness. There has been no unusual cough, phlegm production, or pleurisy. This been no change in bowel or bladder habits. She denies unexplained fatigue or unexplained weight loss, bleeding, rash, or fever. A detailed review of systems was otherwise stable.    PAST MEDICAL HISTORY: Past Medical History:  Diagnosis Date  . Atrial fibrillation ([place]) 2011  . Breast cancer [facility]) 2003   Right lumpectomy and sentinel lymph node biopsy December 2003 for a pT2 pN0, stage IIA  invasive ductal carcinoma, grade 3  . Essential hypertension   . Thyroid disease     PAST SURGICAL HISTORY: Past Surgical History:  Procedure Laterality Date  . BREAST TUMOR  2004  . EXCISION MASS LOWER  EXTREMETIES Left 12/31/2016   Procedure: EXCISION MASS OF LEFT THIGH;  Surgeon: Cristine Polio, MD;  Location: [place];  Service: Plastics;  Laterality: Left;  . LIPOSUCTION Left 12/31/2016   Procedure: LIPOSUCTION ASSISTANCE;  Surgeon: Cristine Polio, MD;  Location: [place];  Service: Plastics;  Laterality: Left;  . RECONSTRUCTION OF NOSE  1985  . TONSILLECTOMY  1953  . TUBAL LIGATION      FAMILY HISTORY Family History  Problem Relation Age of Onset  . Stroke Mother   . Heart attack Father     SOCIAL HISTORY: Updated  June 2019 Married to [employer] and moved to [place]. Patient however has kept her name. She had 2 grandchildren who are both 55 years old.     ADVANCED DIRECTIVES: In place  HEALTH MAINTENANCE: Social History   Tobacco Use  . Smoking status: Never Smoker  . Smokeless tobacco: Never Used  Substance Use Topics  . Alcohol use: Yes    Alcohol/week: 0.6 oz    Types: 1 Glasses of wine per week    Comment: 1-2 glasses daily   . Drug use: No     Colonoscopy: Remote; refuses repeat  PAP:  Bone density:  Lipid panel:  Allergies  Allergen Reactions  . Penicillins     As a child.  . Sulfonamide Derivatives     Current Outpatient Medications  Medication Sig Dispense Refill  . Acetylcysteine (NAC PO) Take 1 tablet by mouth daily. Blood Orange Complex / Selenium    . ALOE VERA PO Take 1 tablet by mouth daily.    Marland Kitchen  Ascorbic Acid (VITAMIN C PO) Take 1 tablet by mouth daily. Lysine    . Calcium Carbonate-Vitamin D (CALCIUM-D PO) Take 1 tablet by mouth daily.    . Capsicum, Cayenne, (CAYENNE PO) Take 1 tablet by mouth daily.    . Cholecalciferol (VITAMIN D PO) Take 1 tablet by mouth daily.    . Coenzyme Q10 (COQ10 PO) Take 1 tablet by mouth daily.    . Digestive Enzymes (BETAINE HCL PO) Take 1 tablet by mouth daily. Digestion formula.    Marland Kitchen GARLIC PO Take 1 tablet by mouth daily. Fermented Black    . Green Tea,  Camillia sinensis, (GREEN TEA PO) Take by mouth daily.    Marland Kitchen KRILL OIL PO Take 1 tablet by mouth daily.    . LevOCARNitine (CARNITINE PO) Take 1 tablet by mouth daily.    Marland Kitchen MAGNESIUM OXIDE PO Take 1 tablet by mouth daily.    . metoprolol tartrate (LOPRESSOR) 50 MG tablet Take 50 mg by mouth at bedtime.    Marland Kitchen MISC NATURAL PRODUCTS EX Apply topically. Bi-Est 80/20 cream - 69m/ml - 10/3KV(2 clicks) twice a day    . MISC NATURAL PRODUCTS PO Take 1 tablet by mouth at bedtime. Naltrexone 4.554m   . MISC NATURAL PRODUCTS PO Take 1 tablet by mouth daily. T3 T4 compound for thyroid    . MISC NATURAL PRODUCTS PO Take 1 tablet by mouth daily. MCT/DHA Collagen Keto Balance    . MISC NATURAL PRODUCTS PO Take 1 tablet by mouth daily. Amla Extract    . MISC NATURAL PRODUCTS PO Take 1 tablet by mouth daily. AMPK Metabolic Activator - Hesperidin    . MISC NATURAL PRODUCTS PO Take 1 tablet by mouth daily. Cruciferous Veggie Extract - Broccoli, 13C, DIM, etc..    . MISC NATURAL PRODUCTS PO Take 1 tablet by mouth daily. Omega 7    . MISC NATURAL PRODUCTS PO Take 1 tablet by mouth daily. Mushroom Complex    . Multiple Vitamin (MULTIVITAMIN) tablet Take 1 tablet by mouth daily. Whole food mineral complex.    . Marland KitchenATTOKINASE PO Take 1 tablet by mouth daily.    . Nutritional Supplements (ADRENAL COMPLEX PO) Take 1 tablet by mouth daily.    . Nutritional Supplements (PYCNOGENOL PO) Take 1 tablet by mouth daily. & gotu Kola    . Omega-3 Fatty Acids (EQL OMEGA 3 FISH OIL PO) Take by mouth daily.      . Probiotic Product (PROBIOTIC DAILY PO) Take 1 tablet by mouth daily.    . Progesterone Micronized (PROGESTERONE PO) Take 1 tablet by mouth daily.    . Marland KitchenUERCETIN PO Take 1 tablet by mouth daily.    . rivaroxaban (XARELTO) 20 MG TABS tablet Take 20 mg by mouth daily with supper.    . TURMERIC CURCUMIN PO Take 1 tablet by mouth daily.     No current facility-administered medications for this visit.     OBJECTIVE:  Middle-aged white woman who appears well  Vitals:   11/26/17 1104  BP: (!) 169/82  Pulse: 73  Resp: 18  SpO2: 100%     Body mass index is 22.73 kg/m.    ECOG FS: 0  Sclerae unicteric, pupils round and equal Oropharynx clear and moist No cervical or supraclavicular adenopathy Lungs no rales or rhonchi Heart regular rate and rhythm today Abd soft, nontender, positive bowel sounds MSK no focal spinal tenderness, no upper extremity lymphedema Neuro: nonfocal, well oriented, appropriate affect Breasts: On the right  side the patient has undergone lumpectomy and radiation with no evidence of local recurrence.  Left breast is benign.  Both axillae are benign.  LAB RESULTS: Patient had lab work at Dr. Marcial Pacas office within the last week but did not bring the records  STUDIES: Bilateral breast  ultrasound at [facility] on 01/31/2017 showed a 4 mm oval mass in the right upper outer breast posterior likely a complex cyst and probably benign. The patient declines further mammography.  ASSESSMENT: 72 y.o.  [name], [place] woman status post right lumpectomy and sentinel lymph node biopsy December 2003 for a pT2 pN0, stage IIA  invasive ductal carcinoma, grade 3, estrogen receptor positive, progesterone receptor and HER2 negative, treated with radiation then anastrozolefor approximately 5 months.  The patient refused further antiestrogen treatment or standard screening with mammography.   PLAN:  Anushri is now 15 years out from definitive surgery for breast cancer with no evidence of disease recurrence.  This is very favorable.  On her most recent mammogram, 2 years ago she had breast density category 8 which is favorable.  Unfortunately she does not receive yearly mammography because of concerns regarding radiation and other issues.  Discussed her atrial fibrillation and the possibility of her undergoing ablation.  At this point she hates the rivaroxaban but is continuing with it  She will see me again in 1  year.  She knows to call for any problems that may develop before that visit.   [name], Virgie Dad, MD  11/26/17 11:48 AM Medical Oncology and Hematology [facility] [address] [place], [place] [address] Tel. [phone]    Fax. [phone]  Alice Rieger, am acting as scribe for Chauncey Cruel MD.  I, Lurline Del MD, have reviewed the above documentation for accuracy and completeness, and I agree with the above.

## 2017-11-26 ENCOUNTER — Telehealth: Payer: Self-pay | Admitting: Oncology

## 2017-11-26 ENCOUNTER — Inpatient Hospital Stay: Payer: Medicare Other | Attending: Oncology | Admitting: Oncology

## 2017-11-26 DIAGNOSIS — Z853 Personal history of malignant neoplasm of breast: Secondary | ICD-10-CM | POA: Diagnosis present

## 2017-11-26 DIAGNOSIS — Z170 Estrogen receptor positive status [ER+]: Secondary | ICD-10-CM | POA: Diagnosis not present

## 2017-11-26 DIAGNOSIS — I4891 Unspecified atrial fibrillation: Secondary | ICD-10-CM | POA: Insufficient documentation

## 2017-11-26 DIAGNOSIS — Z79899 Other long term (current) drug therapy: Secondary | ICD-10-CM | POA: Diagnosis not present

## 2017-11-26 DIAGNOSIS — I10 Essential (primary) hypertension: Secondary | ICD-10-CM | POA: Diagnosis not present

## 2017-11-26 DIAGNOSIS — R51 Headache: Secondary | ICD-10-CM | POA: Diagnosis not present

## 2017-11-26 DIAGNOSIS — Z923 Personal history of irradiation: Secondary | ICD-10-CM | POA: Insufficient documentation

## 2017-11-26 DIAGNOSIS — Z7901 Long term (current) use of anticoagulants: Secondary | ICD-10-CM

## 2017-11-26 DIAGNOSIS — E079 Disorder of thyroid, unspecified: Secondary | ICD-10-CM | POA: Diagnosis not present

## 2017-11-26 DIAGNOSIS — C50411 Malignant neoplasm of upper-outer quadrant of right female breast: Secondary | ICD-10-CM | POA: Insufficient documentation

## 2017-11-26 DIAGNOSIS — Z9223 Personal history of estrogen therapy: Secondary | ICD-10-CM | POA: Insufficient documentation

## 2017-11-26 NOTE — Telephone Encounter (Signed)
Patient declined avs and calendar  °

## 2017-12-03 ENCOUNTER — Ambulatory Visit: Payer: Medicare Other | Admitting: Oncology

## 2018-11-25 ENCOUNTER — Inpatient Hospital Stay: Payer: Medicare Other | Admitting: Oncology

## 2018-12-24 ENCOUNTER — Telehealth: Payer: Self-pay | Admitting: Oncology

## 2018-12-24 NOTE — Telephone Encounter (Signed)
GM PAL 7/22 f/u moved to 8/4. Confirmed with patient.

## 2018-12-31 ENCOUNTER — Ambulatory Visit: Payer: Medicare Other | Admitting: Oncology

## 2019-01-12 ENCOUNTER — Telehealth: Payer: Self-pay | Admitting: Oncology

## 2019-01-12 NOTE — Telephone Encounter (Signed)
Called patient back regarding voicemail that was left, patient would prefer this appointment to be a walk-in visit rather than virtual.

## 2019-01-12 NOTE — Progress Notes (Signed)
No show

## 2019-01-13 ENCOUNTER — Encounter: Payer: Self-pay | Admitting: Oncology

## 2019-01-13 ENCOUNTER — Telehealth: Payer: Self-pay | Admitting: Oncology

## 2019-01-13 ENCOUNTER — Inpatient Hospital Stay: Payer: Medicare Other | Attending: Oncology | Admitting: Oncology

## 2019-01-13 DIAGNOSIS — Z853 Personal history of malignant neoplasm of breast: Secondary | ICD-10-CM

## 2019-01-13 DIAGNOSIS — Z170 Estrogen receptor positive status [ER+]: Secondary | ICD-10-CM

## 2019-01-13 DIAGNOSIS — Z9223 Personal history of estrogen therapy: Secondary | ICD-10-CM

## 2019-01-13 DIAGNOSIS — Z923 Personal history of irradiation: Secondary | ICD-10-CM

## 2019-01-13 NOTE — Telephone Encounter (Signed)
Called pt per 8/04 sch message - no answer . Left message for patient to call back to reschedule appt  

## 2019-01-20 ENCOUNTER — Encounter: Payer: Self-pay | Admitting: Oncology

## 2019-04-06 ENCOUNTER — Telehealth: Payer: Self-pay | Admitting: Oncology

## 2019-04-06 NOTE — Telephone Encounter (Signed)
Returned patient's phone call regarding rescheduling an appointment, left a voicemail. 

## 2019-04-14 ENCOUNTER — Telehealth: Payer: Self-pay | Admitting: Oncology

## 2019-04-14 NOTE — Telephone Encounter (Signed)
Returned patient's phone call regarding rescheduling 08/04 missed appointment, per patient's request appointment has moved to 02/16.

## 2019-07-27 NOTE — Progress Notes (Signed)
ID: [name]   DOB: 03-17-1946  MR#: [id number]  ZLD#:[id number]  Patient Care Team: Bryson Corona, NP as PCP - General (Family Medicine) Leo Grosser, Seymour Bars, MD (Inactive) (Obstetrics and Gynecology) Dyanna Seiter, Virgie Dad, MD as Consulting Physician (Oncology) Glenda Chroman, MD as Referring Physician (Internal Medicine)  OTHER MD:   CHIEF COMPLAINT: remote breast cancer  CURRENT TREATMENT:  observation   INTERVAL HISTORY: [name] returns today for follow-up of her remote breast cancer. She continues under observation.   Bilateral breast ultrasound at [facility] on 01/13/2019 showed no sonographic evidence of malignancy.  Breast density was category B.  Recall that [name] prefers to avoid mammography or radiation in any form   REVIEW OF SYSTEMS: [name] has been quite busy taking care of of her husband's medical issues.  Luckily he is improving and hopefully they can start a walking program soon.  She has not received the Covid vaccine and is not sure that she wants to receive it.  She is still learning about it.  She tells me she had extensive lab work through Dr. Kirk Ruths but she did not bring a copy.  Aside from these issues a detailed review of systems today was stable   PAST MEDICAL HISTORY: Past Medical History:  Diagnosis Date  . Atrial fibrillation ([place]) 2011  . Breast cancer [facility]) 2003   Right lumpectomy and sentinel lymph node biopsy December 2003 for a pT2 pN0, stage IIA  invasive ductal carcinoma, grade 3  . Essential hypertension   . Thyroid disease     PAST SURGICAL HISTORY: Past Surgical History:  Procedure Laterality Date  . BREAST TUMOR  2004  . EXCISION MASS LOWER EXTREMETIES Left 12/31/2016   Procedure: EXCISION MASS OF LEFT THIGH;  Surgeon: Cristine Polio, MD;  Location: [place];  Service: Plastics;  Laterality: Left;  . LIPOSUCTION Left 12/31/2016   Procedure: LIPOSUCTION ASSISTANCE;  Surgeon: Cristine Polio, MD;  Location: [place];  Service: Plastics;  Laterality: Left;  . RECONSTRUCTION OF NOSE  1985  . TONSILLECTOMY  1953  . TUBAL LIGATION      FAMILY HISTORY Family History  Problem Relation Age of Onset  . Stroke Mother   . Heart attack Father     SOCIAL HISTORY: (Updated June 2019) Married to [employer] and moved to [place]. Patient however has kept her name. She had 2 grandchildren who are both 60 years old.     ADVANCED DIRECTIVES: In place  HEALTH MAINTENANCE: Social History   Tobacco Use  . Smoking status: Never Smoker  . Smokeless tobacco: Never Used  Substance Use Topics  . Alcohol use: Yes    Alcohol/week: 1.0 standard drinks    Types: 1 Glasses of wine per week    Comment: 1-2 glasses daily   . Drug use: No     Colonoscopy: Remote; refuses repeat  PAP:  Bone density:  Lipid panel:  Allergies  Allergen Reactions  . Penicillins     As a child.  . Sulfonamide Derivatives     Current Outpatient Medications  Medication Sig Dispense Refill  . Acetylcysteine (NAC PO) Take 1 tablet by mouth daily. Blood Orange Complex / Selenium    . ALOE VERA PO Take 1 tablet by mouth daily.    . Ascorbic Acid (VITAMIN C PO) Take 1 tablet by mouth daily. Lysine    . Calcium Carbonate-Vitamin D (CALCIUM-D PO) Take 1 tablet by mouth daily.    [name], Gretta Arab, (  CAYENNE PO) Take 1 tablet by mouth daily.    . Cholecalciferol (VITAMIN D PO) Take 1 tablet by mouth daily.    . Coenzyme Q10 (COQ10 PO) Take 1 tablet by mouth daily.    . Digestive Enzymes (BETAINE HCL PO) Take 1 tablet by mouth daily. Digestion formula.    Marland Kitchen GARLIC PO Take 1 tablet by mouth daily. Fermented Black    . Green Tea, Camillia sinensis, (GREEN TEA PO) Take by mouth daily.    Marland Kitchen KRILL OIL PO Take 1 tablet by mouth daily.    . LevOCARNitine (CARNITINE PO) Take 1 tablet by mouth daily.    Marland Kitchen MAGNESIUM OXIDE PO Take 1 tablet by mouth daily.    . metoprolol tartrate (LOPRESSOR) 50 MG tablet Take 50 mg by  mouth at bedtime.    Marland Kitchen MISC NATURAL PRODUCTS EX Apply topically. Bi-Est 80/20 cream - 7m/ml - 10/6YI(2 clicks) twice a day    . MISC NATURAL PRODUCTS PO Take 1 tablet by mouth at bedtime. Naltrexone 4.555m   . MISC NATURAL PRODUCTS PO Take 1 tablet by mouth daily. T3 T4 compound for thyroid    . MISC NATURAL PRODUCTS PO Take 1 tablet by mouth daily. MCT/DHA Collagen Keto Balance    . MISC NATURAL PRODUCTS PO Take 1 tablet by mouth daily. Amla Extract    . MISC NATURAL PRODUCTS PO Take 1 tablet by mouth daily. AMPK Metabolic Activator - Hesperidin    . MISC NATURAL PRODUCTS PO Take 1 tablet by mouth daily. Cruciferous Veggie Extract - Broccoli, 13C, DIM, etc..    . MISC NATURAL PRODUCTS PO Take 1 tablet by mouth daily. Omega 7    . MISC NATURAL PRODUCTS PO Take 1 tablet by mouth daily. Mushroom Complex    . Multiple Vitamin (MULTIVITAMIN) tablet Take 1 tablet by mouth daily. Whole food mineral complex.    . Marland KitchenATTOKINASE PO Take 1 tablet by mouth daily.    . Nutritional Supplements (ADRENAL COMPLEX PO) Take 1 tablet by mouth daily.    . Nutritional Supplements (PYCNOGENOL PO) Take 1 tablet by mouth daily. & gotu Kola    . Omega-3 Fatty Acids (EQL OMEGA 3 FISH OIL PO) Take by mouth daily.      . Probiotic Product (PROBIOTIC DAILY PO) Take 1 tablet by mouth daily.    . Progesterone Micronized (PROGESTERONE PO) Take 1 tablet by mouth daily.    . Marland KitchenUERCETIN PO Take 1 tablet by mouth daily.    . rivaroxaban (XARELTO) 20 MG TABS tablet Take 20 mg by mouth daily with supper.    . TURMERIC CURCUMIN PO Take 1 tablet by mouth daily.     No current facility-administered medications for this visit.    OBJECTIVE: Middle-aged white woman who appears younger than stated age  Vi39  07/28/19 1345  BP: (!) 162/97  Pulse: (!) 107  Resp: 18  Temp: 97.8 F (36.6 C)  SpO2: 100%     Body mass index is 22.87 kg/m.    ECOG FS: 0  Sclerae unicteric, EOMs intact Wearing a mask No cervical or  supraclavicular adenopathy Lungs no rales or rhonchi Heart regular rate and rhythm Abd soft, nontender, positive bowel sounds MSK no focal spinal tenderness, no upper extremity lymphedema Neuro: nonfocal, well oriented, positive for affect Breasts: The right breast is status post lumpectomy and radiation.  There is no evidence of local recurrence.  The left breast is unremarkable.  Both axillae are benign.  LAB RESULTS: None on file   STUDIES: The patient declines mammography.   ASSESSMENT: 74 y.o.  [name], [place] woman status post right lumpectomy and sentinel lymph node biopsy December 2003 for a pT2 pN0, stage IIA  invasive ductal carcinoma, grade 3, estrogen receptor positive, progesterone receptor and HER2 negative, treated with radiation then anastrozolefor approximately 5 months.  The patient refused further antiestrogen treatment or standard screening with mammography.    PLAN:  Priscille is now a little over 16 years out from definitive surgery for her breast cancer with no evidence of disease recurrence.  This is very favorable.  Although her breast density is to be her breast exam is difficult as her breasts are relatively lumpy.  I think it would be advisable to obtain additional screening on a routine basis if she were agreeable.  I understand her reluctance regarding radiation.  However breast MRI does not involve radiation.  It does have issues regarding false positives and cost.  Today we discussed "fast MRI" in detail.  She has the information.  If she decides to try that I will be glad to put the order in for her.  She tells me she is going to ask her primary care doctor who follows her labs very closely to send [place] a copy of her labs for our records.  Otherwise she will return to see me in 1 year.  She knows to call for any other issue that may develop before her next visit.  Total encounter time 25 minutes.*  Erica Osuna, Virgie Dad, MD  07/28/19 2:25 PM Medical Oncology and  Hematology [facility] [place], [place] [address] Tel. [phone]    Fax. [phone]   I, Wilburn Mylar, am acting as scribe for Dr. Virgie Dad. Decorian Schuenemann.  I, Lurline Del MD, have reviewed the above documentation for accuracy and completeness, and I agree with the above.   *Total Encounter Time as defined by the Centers for Medicare and Medicaid Services includes, in addition to the face-to-face time of a patient visit (documented in the note above) non-face-to-face time: obtaining and reviewing outside history, ordering and reviewing medications, tests or procedures, care coordination (communications with other health care professionals or caregivers) and documentation in the medical record.

## 2019-07-28 ENCOUNTER — Inpatient Hospital Stay: Payer: Medicare PPO | Attending: Oncology | Admitting: Oncology

## 2019-07-28 ENCOUNTER — Other Ambulatory Visit: Payer: Self-pay

## 2019-07-28 VITALS — BP 162/97 | HR 107 | Temp 97.8°F | Resp 18 | Wt 154.9 lb

## 2019-07-28 DIAGNOSIS — Z79899 Other long term (current) drug therapy: Secondary | ICD-10-CM | POA: Diagnosis not present

## 2019-07-28 DIAGNOSIS — Z923 Personal history of irradiation: Secondary | ICD-10-CM | POA: Insufficient documentation

## 2019-07-28 DIAGNOSIS — E079 Disorder of thyroid, unspecified: Secondary | ICD-10-CM | POA: Insufficient documentation

## 2019-07-28 DIAGNOSIS — Z170 Estrogen receptor positive status [ER+]: Secondary | ICD-10-CM | POA: Diagnosis not present

## 2019-07-28 DIAGNOSIS — Z853 Personal history of malignant neoplasm of breast: Secondary | ICD-10-CM | POA: Insufficient documentation

## 2019-07-28 DIAGNOSIS — Z7901 Long term (current) use of anticoagulants: Secondary | ICD-10-CM | POA: Diagnosis not present

## 2019-07-28 DIAGNOSIS — I482 Chronic atrial fibrillation, unspecified: Secondary | ICD-10-CM

## 2019-07-28 DIAGNOSIS — Z8249 Family history of ischemic heart disease and other diseases of the circulatory system: Secondary | ICD-10-CM | POA: Diagnosis not present

## 2019-07-28 DIAGNOSIS — C50411 Malignant neoplasm of upper-outer quadrant of right female breast: Secondary | ICD-10-CM | POA: Diagnosis not present

## 2019-07-28 DIAGNOSIS — I10 Essential (primary) hypertension: Secondary | ICD-10-CM | POA: Diagnosis not present

## 2019-07-28 DIAGNOSIS — I4891 Unspecified atrial fibrillation: Secondary | ICD-10-CM | POA: Insufficient documentation

## 2019-07-29 ENCOUNTER — Telehealth: Payer: Self-pay | Admitting: Oncology

## 2019-07-29 NOTE — Telephone Encounter (Signed)
I talk with patient regarding schedule  

## 2020-02-10 DIAGNOSIS — R7303 Prediabetes: Secondary | ICD-10-CM | POA: Diagnosis not present

## 2020-02-10 DIAGNOSIS — Z780 Asymptomatic menopausal state: Secondary | ICD-10-CM | POA: Diagnosis not present

## 2020-02-10 DIAGNOSIS — E039 Hypothyroidism, unspecified: Secondary | ICD-10-CM | POA: Diagnosis not present

## 2020-02-10 DIAGNOSIS — E7212 Methylenetetrahydrofolate reductase deficiency: Secondary | ICD-10-CM | POA: Diagnosis not present

## 2020-02-10 DIAGNOSIS — D510 Vitamin B12 deficiency anemia due to intrinsic factor deficiency: Secondary | ICD-10-CM | POA: Diagnosis not present

## 2020-02-10 DIAGNOSIS — I4891 Unspecified atrial fibrillation: Secondary | ICD-10-CM | POA: Diagnosis not present

## 2020-02-11 DIAGNOSIS — Z1231 Encounter for screening mammogram for malignant neoplasm of breast: Secondary | ICD-10-CM | POA: Diagnosis not present

## 2020-03-16 DIAGNOSIS — D2271 Melanocytic nevi of right lower limb, including hip: Secondary | ICD-10-CM | POA: Diagnosis not present

## 2020-03-16 DIAGNOSIS — R202 Paresthesia of skin: Secondary | ICD-10-CM | POA: Diagnosis not present

## 2020-03-16 DIAGNOSIS — D2262 Melanocytic nevi of left upper limb, including shoulder: Secondary | ICD-10-CM | POA: Diagnosis not present

## 2020-03-16 DIAGNOSIS — Z08 Encounter for follow-up examination after completed treatment for malignant neoplasm: Secondary | ICD-10-CM | POA: Diagnosis not present

## 2020-03-16 DIAGNOSIS — D2261 Melanocytic nevi of right upper limb, including shoulder: Secondary | ICD-10-CM | POA: Diagnosis not present

## 2020-03-16 DIAGNOSIS — Z85828 Personal history of other malignant neoplasm of skin: Secondary | ICD-10-CM | POA: Diagnosis not present

## 2020-03-18 DIAGNOSIS — E2839 Other primary ovarian failure: Secondary | ICD-10-CM | POA: Diagnosis not present

## 2020-04-25 DIAGNOSIS — Z6823 Body mass index (BMI) 23.0-23.9, adult: Secondary | ICD-10-CM | POA: Diagnosis not present

## 2020-04-25 DIAGNOSIS — Z01419 Encounter for gynecological examination (general) (routine) without abnormal findings: Secondary | ICD-10-CM | POA: Diagnosis not present

## 2020-04-25 DIAGNOSIS — Z1211 Encounter for screening for malignant neoplasm of colon: Secondary | ICD-10-CM | POA: Diagnosis not present

## 2020-04-25 DIAGNOSIS — L723 Sebaceous cyst: Secondary | ICD-10-CM | POA: Diagnosis not present

## 2020-05-09 DIAGNOSIS — R5383 Other fatigue: Secondary | ICD-10-CM | POA: Diagnosis not present

## 2020-05-09 DIAGNOSIS — E039 Hypothyroidism, unspecified: Secondary | ICD-10-CM | POA: Diagnosis not present

## 2020-05-09 DIAGNOSIS — E28310 Symptomatic premature menopause: Secondary | ICD-10-CM | POA: Diagnosis not present

## 2020-07-07 ENCOUNTER — Encounter (INDEPENDENT_AMBULATORY_CARE_PROVIDER_SITE_OTHER): Payer: Self-pay | Admitting: Ophthalmology

## 2020-07-07 ENCOUNTER — Other Ambulatory Visit: Payer: Self-pay

## 2020-07-07 ENCOUNTER — Ambulatory Visit (INDEPENDENT_AMBULATORY_CARE_PROVIDER_SITE_OTHER): Payer: Medicare PPO | Admitting: Ophthalmology

## 2020-07-07 DIAGNOSIS — H43811 Vitreous degeneration, right eye: Secondary | ICD-10-CM | POA: Diagnosis not present

## 2020-07-07 DIAGNOSIS — H33311 Horseshoe tear of retina without detachment, right eye: Secondary | ICD-10-CM | POA: Diagnosis not present

## 2020-07-07 DIAGNOSIS — H2513 Age-related nuclear cataract, bilateral: Secondary | ICD-10-CM | POA: Insufficient documentation

## 2020-07-07 NOTE — Assessment & Plan Note (Signed)
Occasional floater but no active disease

## 2020-07-07 NOTE — Assessment & Plan Note (Signed)

## 2020-07-07 NOTE — Assessment & Plan Note (Signed)
Well treated in the past, no new retinal breaks

## 2020-07-07 NOTE — Progress Notes (Signed)
07/07/2020     CHIEF COMPLAINT Patient presents for Retina Follow Up (1 Year F/U OU//Pt denies noticeable changes to [place] OU since last visit. Pt denies ocular pain, flashes of light, or floaters OU. //)   HISTORY OF PRESENT ILLNESS: [name] is a 75 y.o. female who presents to the clinic today for:   HPI    Retina Follow Up    Patient presents with  Other.  In both eyes.  This started 1 year ago.  Severity is mild.  Duration of 1 year.  Since onset it is stable. Additional comments: 1 Year F/U OU  Pt denies noticeable changes to [place] OU since last visit. Pt denies ocular pain, flashes of light, or floaters OU.          Last edited by Rockie Neighbours, [place] on 07/07/2020  1:10 PM. (History)      Referring physician: Bryson Corona, NP [phone]  [place] [address]  HISTORICAL INFORMATION:   Selected notes from the [place]: No current outpatient medications on file. (Ophthalmic Drugs)   No current facility-administered medications for this visit. (Ophthalmic Drugs)   Current Outpatient Medications (Other)  Medication Sig  . Acetylcysteine (NAC PO) Take 1 tablet by mouth daily. Blood Orange Complex / Selenium  . ALOE VERA PO Take 1 tablet by mouth daily.  . Ascorbic Acid (VITAMIN C PO) Take 1 tablet by mouth daily. Lysine  . Calcium Carbonate-Vitamin D (CALCIUM-D PO) Take 1 tablet by mouth daily.  . Capsicum, Cayenne, (CAYENNE PO) Take 1 tablet by mouth daily.  . Cholecalciferol (VITAMIN D PO) Take 1 tablet by mouth daily.  . Coenzyme Q10 (COQ10 PO) Take 1 tablet by mouth daily.  . Digestive Enzymes (BETAINE HCL PO) Take 1 tablet by mouth daily. Digestion formula.  Marland Kitchen GARLIC PO Take 1 tablet by mouth daily. Fermented Black  . Green Tea, Camillia sinensis, (GREEN TEA PO) Take by mouth daily.  Marland Kitchen KRILL OIL PO Take 1 tablet by mouth daily.  . LevOCARNitine (CARNITINE PO) Take 1 tablet by mouth daily.  Marland Kitchen  MAGNESIUM OXIDE PO Take 1 tablet by mouth daily.  . metoprolol tartrate (LOPRESSOR) 50 MG tablet Take 50 mg by mouth at bedtime.  Marland Kitchen MISC NATURAL PRODUCTS EX Apply topically. Bi-Est 80/20 cream - 5mg /ml - 1/2WP (2 clicks) twice a day  . MISC NATURAL PRODUCTS PO Take 1 tablet by mouth at bedtime. Naltrexone 4.5mg   . MISC NATURAL PRODUCTS PO Take 1 tablet by mouth daily. T3 T4 compound for thyroid  . MISC NATURAL PRODUCTS PO Take 1 tablet by mouth daily. MCT/DHA Collagen Keto Balance  . MISC NATURAL PRODUCTS PO Take 1 tablet by mouth daily. Amla Extract  . MISC NATURAL PRODUCTS PO Take 1 tablet by mouth daily. AMPK Metabolic Activator - Hesperidin  . MISC NATURAL PRODUCTS PO Take 1 tablet by mouth daily. Cruciferous Veggie Extract - Broccoli, 13C, DIM, etc..  . MISC NATURAL PRODUCTS PO Take 1 tablet by mouth daily. Omega 7  . MISC NATURAL PRODUCTS PO Take 1 tablet by mouth daily. Mushroom Complex  . Multiple Vitamin (MULTIVITAMIN) tablet Take 1 tablet by mouth daily. Whole food mineral complex.  Marland Kitchen NATTOKINASE PO Take 1 tablet by mouth daily.  . Nutritional Supplements (ADRENAL COMPLEX PO) Take 1 tablet by mouth daily.  . Nutritional Supplements (PYCNOGENOL PO) Take 1 tablet by mouth daily. & gotu Kola  .  Omega-3 Fatty Acids (EQL OMEGA 3 FISH OIL PO) Take by mouth daily.    . Probiotic Product (PROBIOTIC DAILY PO) Take 1 tablet by mouth daily.  . Progesterone Micronized (PROGESTERONE PO) Take 1 tablet by mouth daily.  Marland Kitchen QUERCETIN PO Take 1 tablet by mouth daily.  . rivaroxaban (XARELTO) 20 MG TABS tablet Take 20 mg by mouth daily with supper.  . TURMERIC CURCUMIN PO Take 1 tablet by mouth daily.   No current facility-administered medications for this visit. (Other)      REVIEW OF SYSTEMS:    ALLERGIES Allergies  Allergen Reactions  . Penicillins     As a child.  . Sulfonamide Derivatives     PAST MEDICAL HISTORY Past Medical History:  Diagnosis Date  . Atrial fibrillation ([place])  2011  . Breast cancer [facility]) 2003   Right lumpectomy and sentinel lymph node biopsy December 2003 for a pT2 pN0, stage IIA  invasive ductal carcinoma, grade 3  . Essential hypertension   . Thyroid disease    Past Surgical History:  Procedure Laterality Date  . BREAST TUMOR  2004  . EXCISION MASS LOWER EXTREMETIES Left 12/31/2016   Procedure: EXCISION MASS OF LEFT THIGH;  Surgeon: Cristine Polio, MD;  Location: [place];  Service: Plastics;  Laterality: Left;  . LIPOSUCTION Left 12/31/2016   Procedure: LIPOSUCTION ASSISTANCE;  Surgeon: Cristine Polio, MD;  Location: [place];  Service: Plastics;  Laterality: Left;  . RECONSTRUCTION OF NOSE  1985  . TONSILLECTOMY  1953  . TUBAL LIGATION      FAMILY HISTORY Family History  Problem Relation Age of Onset  . Stroke Mother   . Heart attack Father     SOCIAL HISTORY Social History   Tobacco Use  . Smoking status: Never Smoker  . Smokeless tobacco: Never Used  Substance Use Topics  . Alcohol use: Yes    Alcohol/week: 1.0 standard drink    Types: 1 Glasses of wine per week    Comment: 1-2 glasses daily   . Drug use: No         OPHTHALMIC EXAM:  Base Eye Exam    Visual Acuity (ETDRS)      Right Left   Dist [place] 20/20 -1 20/25 -1       Tonometry (Tonopen, 1:10 PM)      Right Left   Pressure 19 21       Pupils      Pupils Dark Light Shape React APD   Right PERRL 6 5 Round Brisk None   Left PERRL 6 5 Round Brisk None       Visual Fields (Counting fingers)      Left Right    Full Full       Extraocular Movement      Right Left    Full Full       Neuro/Psych    Oriented x3: Yes   Mood/Affect: Normal       Dilation    Both eyes: 1.0% Mydriacyl, 2.5% Phenylephrine @ 1:16 PM        Slit Lamp and Fundus Exam    External Exam      Right Left   External Normal Normal       Slit Lamp Exam      Right Left   Lids/Lashes Normal Normal   Conjunctiva/Sclera White and  quiet White and quiet   Cornea Clear Clear   Anterior Chamber Deep and quiet Deep and  quiet   Iris Round and reactive Round and reactive   Lens  2+ Nuclear sclerosis 2+ Nuclear sclerosis   Anterior Vitreous Normal Normal       Fundus Exam      Right Left   Posterior Vitreous Posterior vitreous detachment Posterior vitreous detachment   Disc Normal Normal   C/D Ratio 0.4 0.4   Macula Normal Normal   Vessels Normal Normal   Periphery Good retinopexy superotemporal no new retinal breaks No holes or tears          IMAGING AND PROCEDURES  Imaging and Procedures for 07/07/20  Color Fundus Photography Optos - OU - Both Eyes       Right Eye Progression has been stable. Disc findings include normal observations. Macula : normal observations. Vessels : normal observations. Periphery : normal observations.   Left Eye Progression has been stable. Disc findings include normal observations. Macula : normal observations. Vessels : normal observations. Periphery : normal observations.   Notes Good laser retinopexy superotemporal, no new breaks                ASSESSMENT/PLAN:  Age-related nuclear cataract of both eyes The nature of cataract was discussed with the patient as well as the elective nature of surgery. The patient was reassured that surgery at a later date does not put the patient at risk for a worse outcome. It was emphasized that the need for surgery is dictated by the patient's quality of life as influenced by the cataract. Patient was instructed to maintain close follow up with their general eye care doctor.  [place] retinal tear of right eye Well treated in the past, no new retinal breaks  Posterior vitreous detachment of right eye Occasional floater but no active disease      ICD-10-CM   1. [place]hoe retinal tear of right eye  H33.311 Color Fundus Photography Optos - OU - Both Eyes  2. Posterior vitreous detachment of right eye  H43.811 Color Fundus  Photography Optos - OU - Both Eyes  3. Age-related nuclear cataract of both eyes  H25.13     1.  I spent some time explaining to the patient the the nature of cataract and the likely symptoms that should occur gradually over the coming years to decades.  2.  3.  Ophthalmic Meds Ordered this visit:  No orders of the defined types were placed in this encounter.      Return in about 1 year (around 07/07/2021) for DILATE OU, COLOR FP.  There are no Patient Instructions on file for this visit.   Explained the diagnoses, plan, and follow up with the patient and they expressed understanding.  Patient expressed understanding of the importance of proper follow up care.   Clent Demark Kadin Bera M.D. Diseases & Surgery of the Retina and Vitreous Retina & Diabetic [place] 07/07/20     Abbreviations: M myopia (nearsighted); A astigmatism; H hyperopia (farsighted); P presbyopia; Mrx spectacle prescription;  CTL contact lenses; OD right eye; OS left eye; OU both eyes  XT exotropia; ET esotropia; PEK punctate epithelial keratitis; PEE punctate epithelial erosions; DES dry eye syndrome; MGD meibomian gland dysfunction; ATs artificial tears; PFAT's preservative free artificial tears; [place] nuclear sclerotic cataract; PSC posterior subcapsular cataract; ERM epi-retinal membrane; PVD posterior vitreous detachment; RD retinal detachment; DM diabetes mellitus; DR diabetic retinopathy; NPDR non-proliferative diabetic retinopathy; PDR proliferative diabetic retinopathy; CSME clinically significant macular edema; DME diabetic macular edema; dbh dot blot hemorrhages; CWS cotton wool spot; POAG  primary open angle glaucoma; C/D cup-to-disc ratio; HVF humphrey visual field; GVF goldmann visual field; OCT optical coherence tomography; IOP intraocular pressure; BRVO Branch retinal vein occlusion; CRVO central retinal vein occlusion; CRAO central retinal artery occlusion; BRAO branch retinal artery occlusion; RT retinal  tear; SB scleral buckle; PPV pars plana vitrectomy; VH Vitreous hemorrhage; PRP panretinal laser photocoagulation; IVK intravitreal kenalog; VMT vitreomacular traction; MH Macular hole;  NVD neovascularization of the disc; NVE neovascularization elsewhere; AREDS age related eye disease study; ARMD age related macular degeneration; POAG primary open angle glaucoma; EBMD epithelial/anterior basement membrane dystrophy; ACIOL anterior chamber intraocular lens; IOL intraocular lens; PCIOL posterior chamber intraocular lens; Phaco/IOL phacoemulsification with intraocular lens placement; [place] photorefractive keratectomy; LASIK laser assisted in situ keratomileusis; HTN hypertension; DM diabetes mellitus; COPD chronic obstructive pulmonary disease

## 2020-07-27 ENCOUNTER — Inpatient Hospital Stay: Payer: Medicare PPO | Admitting: Oncology

## 2020-08-22 ENCOUNTER — Ambulatory Visit: Payer: Medicare PPO | Admitting: Oncology

## 2020-09-05 ENCOUNTER — Telehealth: Payer: Self-pay | Admitting: Oncology

## 2020-09-05 NOTE — Telephone Encounter (Signed)
R/s appt per 3/28 sch msg. Pt aware.

## 2020-09-06 ENCOUNTER — Inpatient Hospital Stay: Payer: Medicare PPO | Admitting: Oncology

## 2020-10-17 ENCOUNTER — Telehealth: Payer: Self-pay | Admitting: Oncology

## 2020-10-17 NOTE — Telephone Encounter (Signed)
Rescheduled appointment per 05/09 scheduled message, Patient is aware.

## 2020-10-19 ENCOUNTER — Inpatient Hospital Stay: Payer: Medicare PPO | Admitting: Oncology

## 2020-10-31 NOTE — Progress Notes (Signed)
ID: [name]   DOB: Jul 23, 1945  MR#: [id number]  KZS#:[id number]  Patient Care Team: Bryson Corona, NP as PCP - General (Family Medicine) Leo Grosser, Seymour Bars, MD (Inactive) (Obstetrics and Gynecology) Candela Krul, Virgie Dad, MD as Consulting Physician (Oncology) Glenda Chroman, MD as Referring Physician (Internal Medicine)  OTHER MD:   CHIEF COMPLAINT: remote breast cancer  CURRENT TREATMENT:  observation   INTERVAL HISTORY: [name] returns today for follow-up of her remote breast cancer. She continues under observation.   Recall that [name] prefers to avoid mammography or radiation in any form.  However she did have screening mammography at [facility] 02/11/2020 showing breast density category A.  There were no changes compared to 5 years prior and no evidence of malignancy.  This is very favorable.  The patient was advised about the possibility of every 2-year screening.  A CA 27-29 obtained in December was 20.6   REVIEW OF SYSTEMS: [name] continues to exercise regularly.  She walks between 8 and 12,000 steps most days.  She tells me some lab work she had showed mycoplasma and there is additional lab work later this month or early next month that is pending.   COVID 19 VACCINATION STATUS: Refuses vaccination; never had COVID; is taking ivermectin   PAST MEDICAL HISTORY: Past Medical History:  Diagnosis Date  . Atrial fibrillation ([place]) 2011  . Breast cancer [facility]) 2003   Right lumpectomy and sentinel lymph node biopsy December 2003 for a pT2 pN0, stage IIA  invasive ductal carcinoma, grade 3  . Essential hypertension   . Thyroid disease     PAST SURGICAL HISTORY: Past Surgical History:  Procedure Laterality Date  . BREAST TUMOR  2004  . EXCISION MASS LOWER EXTREMETIES Left 12/31/2016   Procedure: EXCISION MASS OF LEFT THIGH;  Surgeon: Cristine Polio, MD;  Location: [place];  Service: Plastics;  Laterality: Left;  . LIPOSUCTION Left 12/31/2016    Procedure: LIPOSUCTION ASSISTANCE;  Surgeon: Cristine Polio, MD;  Location: [place];  Service: Plastics;  Laterality: Left;  . RECONSTRUCTION OF NOSE  1985  . TONSILLECTOMY  1953  . TUBAL LIGATION      FAMILY HISTORY Family History  Problem Relation Age of Onset  . Stroke Mother   . Heart attack Father     SOCIAL HISTORY: (Updated June 2019) Married to [employer] and moved to [place]. Patient however has kept her name.     ADVANCED DIRECTIVES: In place   HEALTH MAINTENANCE: Social History   Tobacco Use  . Smoking status: Never Smoker  . Smokeless tobacco: Never Used  Substance Use Topics  . Alcohol use: Yes    Alcohol/week: 1.0 standard drink    Types: 1 Glasses of wine per week    Comment: 1-2 glasses daily   . Drug use: No     Colonoscopy: Remote; refuses repeat  PAP:  Bone density:  Lipid panel:  Allergies  Allergen Reactions  . Penicillins     As a child.  . Sulfonamide Derivatives     Current Outpatient Medications  Medication Sig Dispense Refill  . Acetylcysteine (NAC PO) Take 1 tablet by mouth daily. Blood Orange Complex / Selenium    . ALOE VERA PO Take 1 tablet by mouth daily.    . Ascorbic Acid (VITAMIN C PO) Take 1 tablet by mouth daily. Lysine    . Calcium Carbonate-Vitamin D (CALCIUM-D PO) Take 1 tablet by mouth daily.    . Capsicum, Cayenne, (CAYENNE  PO) Take 1 tablet by mouth daily.    . Cholecalciferol (VITAMIN D PO) Take 1 tablet by mouth daily.    . Coenzyme Q10 (COQ10 PO) Take 1 tablet by mouth daily.    . Digestive Enzymes (BETAINE HCL PO) Take 1 tablet by mouth daily. Digestion formula.    Marland Kitchen GARLIC PO Take 1 tablet by mouth daily. Fermented Black    . Green Tea, Camillia sinensis, (GREEN TEA PO) Take by mouth daily.    Marland Kitchen KRILL OIL PO Take 1 tablet by mouth daily.    . LevOCARNitine (CARNITINE PO) Take 1 tablet by mouth daily.    Marland Kitchen MAGNESIUM OXIDE PO Take 1 tablet by mouth daily.    . metoprolol tartrate  (LOPRESSOR) 50 MG tablet Take 50 mg by mouth at bedtime.    Marland Kitchen MISC NATURAL PRODUCTS EX Apply topically. Bi-Est 80/20 cream - 59m/ml - 12/9HB(2 clicks) twice a day    . MISC NATURAL PRODUCTS PO Take 1 tablet by mouth at bedtime. Naltrexone 4.511m   . MISC NATURAL PRODUCTS PO Take 1 tablet by mouth daily. T3 T4 compound for thyroid    . MISC NATURAL PRODUCTS PO Take 1 tablet by mouth daily. MCT/DHA Collagen Keto Balance    . MISC NATURAL PRODUCTS PO Take 1 tablet by mouth daily. Amla Extract    . MISC NATURAL PRODUCTS PO Take 1 tablet by mouth daily. AMPK Metabolic Activator - Hesperidin    . MISC NATURAL PRODUCTS PO Take 1 tablet by mouth daily. Cruciferous Veggie Extract - Broccoli, 13C, DIM, etc..    . MISC NATURAL PRODUCTS PO Take 1 tablet by mouth daily. Omega 7    . MISC NATURAL PRODUCTS PO Take 1 tablet by mouth daily. Mushroom Complex    . Multiple Vitamin (MULTIVITAMIN) tablet Take 1 tablet by mouth daily. Whole food mineral complex.    . Marland KitchenATTOKINASE PO Take 1 tablet by mouth daily.    . Nutritional Supplements (ADRENAL COMPLEX PO) Take 1 tablet by mouth daily.    . Nutritional Supplements (PYCNOGENOL PO) Take 1 tablet by mouth daily. & gotu Kola    . Omega-3 Fatty Acids (EQL OMEGA 3 FISH OIL PO) Take by mouth daily.      . Probiotic Product (PROBIOTIC DAILY PO) Take 1 tablet by mouth daily.    . Progesterone Micronized (PROGESTERONE PO) Take 1 tablet by mouth daily.    . Marland KitchenUERCETIN PO Take 1 tablet by mouth daily.    . rivaroxaban (XARELTO) 20 MG TABS tablet Take 20 mg by mouth daily with supper.    . TURMERIC CURCUMIN PO Take 1 tablet by mouth daily.     No current facility-administered medications for this visit.    OBJECTIVE: Middle-aged white woman who appears younger than stated age  Vi93  11/01/20 1351  BP: (!) 165/78  Pulse: 97  Resp: 18  Temp: 97.7 F (36.5 C)  SpO2: 100%     Body mass index is 22.74 kg/m.    ECOG FS: 0  Sclerae unicteric, EOMs intact Wearing  a mask No cervical or supraclavicular adenopathy Lungs no rales or rhonchi Heart regular rate and rhythm Abd soft, nontender, positive bowel sounds MSK no focal spinal tenderness, no upper extremity lymphedema Neuro: nonfocal, well oriented, appropriate affect Breasts: The right breast has undergone lumpectomy and radiation, with no evidence of local recurrence the left breast and both axillae are unremarkable.   LAB RESULTS: Patient will fax outside labs as they  become available   STUDIES: No results found.    ASSESSMENT: 75 y.o.  [name], [name] woman status post right lumpectomy and sentinel lymph node biopsy December 2003 for a pT2 pN0, stage IIA  invasive ductal carcinoma, grade 3, estrogen receptor positive, progesterone receptor and HER2 negative, treated with radiation then anastrozolefor approximately 5 months.  The patient refused further antiestrogen treatment or standard screening with mammography.    PLAN:  [name] is just about 19 years out from definitive surgery for her breast cancer with no evidence of disease recurrence.  This is very favorable.  I commended her for going through mammography this year.  This was a difficult decision for her and at this point she is not planning to repeated at least not for another 2 years  I would not be uncomfortable releasing her to her primary care physician but she tells me she gets reassurance from coming here and a yearly basis and she is a very good candidate for our survivorship program.  She also tells me that she knows our survivorship nurse practitioner from school.  Accordingly I am setting her up for a visit in survivorship a year from now  She knows to call for any other issue that may develop before the next visit  Total encounter time 25 minutes.*   Akil Hoos, Virgie Dad, MD  11/01/20 6:12 PM Medical Oncology and Hematology [facility] [place], [place] [address] Tel. [phone]    Fax.  [phone]   I, Wilburn Mylar, am acting as scribe for Dr. Virgie Dad. Sharline Lehane.  I, Lurline Del MD, have reviewed the above documentation for accuracy and completeness, and I agree with the above.   *Total Encounter Time as defined by the Centers for Medicare and Medicaid Services includes, in addition to the face-to-face time of a patient visit (documented in the note above) non-face-to-face time: obtaining and reviewing outside history, ordering and reviewing medications, tests or procedures, care coordination (communications with other health care professionals or caregivers) and documentation in the medical record.

## 2020-11-01 ENCOUNTER — Other Ambulatory Visit: Payer: Self-pay

## 2020-11-01 ENCOUNTER — Inpatient Hospital Stay: Payer: Medicare PPO | Attending: Oncology | Admitting: Oncology

## 2020-11-01 VITALS — BP 165/78 | HR 97 | Temp 97.7°F | Resp 18 | Ht 69.0 in | Wt 154.0 lb

## 2020-11-01 DIAGNOSIS — Z853 Personal history of malignant neoplasm of breast: Secondary | ICD-10-CM | POA: Insufficient documentation

## 2020-11-01 DIAGNOSIS — C50411 Malignant neoplasm of upper-outer quadrant of right female breast: Secondary | ICD-10-CM | POA: Diagnosis not present

## 2020-11-01 DIAGNOSIS — I4891 Unspecified atrial fibrillation: Secondary | ICD-10-CM | POA: Diagnosis not present

## 2020-11-01 DIAGNOSIS — Z170 Estrogen receptor positive status [ER+]: Secondary | ICD-10-CM | POA: Diagnosis not present

## 2020-11-01 DIAGNOSIS — Z923 Personal history of irradiation: Secondary | ICD-10-CM | POA: Insufficient documentation

## 2020-11-01 DIAGNOSIS — Z7901 Long term (current) use of anticoagulants: Secondary | ICD-10-CM | POA: Insufficient documentation

## 2020-11-01 DIAGNOSIS — I10 Essential (primary) hypertension: Secondary | ICD-10-CM | POA: Insufficient documentation

## 2020-11-01 DIAGNOSIS — E039 Hypothyroidism, unspecified: Secondary | ICD-10-CM | POA: Insufficient documentation

## 2020-11-03 DIAGNOSIS — E063 Autoimmune thyroiditis: Secondary | ICD-10-CM | POA: Diagnosis not present

## 2020-11-03 DIAGNOSIS — M2550 Pain in unspecified joint: Secondary | ICD-10-CM | POA: Diagnosis not present

## 2020-11-03 DIAGNOSIS — E038 Other specified hypothyroidism: Secondary | ICD-10-CM | POA: Diagnosis not present

## 2020-11-03 DIAGNOSIS — R5382 Chronic fatigue, unspecified: Secondary | ICD-10-CM | POA: Diagnosis not present

## 2020-11-03 DIAGNOSIS — D510 Vitamin B12 deficiency anemia due to intrinsic factor deficiency: Secondary | ICD-10-CM | POA: Diagnosis not present

## 2020-11-03 DIAGNOSIS — Z780 Asymptomatic menopausal state: Secondary | ICD-10-CM | POA: Diagnosis not present

## 2020-11-03 DIAGNOSIS — E039 Hypothyroidism, unspecified: Secondary | ICD-10-CM | POA: Diagnosis not present

## 2020-11-03 DIAGNOSIS — R5383 Other fatigue: Secondary | ICD-10-CM | POA: Diagnosis not present

## 2020-11-03 DIAGNOSIS — T7840XA Allergy, unspecified, initial encounter: Secondary | ICD-10-CM | POA: Diagnosis not present

## 2020-11-04 ENCOUNTER — Encounter: Payer: Self-pay | Admitting: Oncology

## 2020-11-23 DIAGNOSIS — I4891 Unspecified atrial fibrillation: Secondary | ICD-10-CM | POA: Diagnosis not present

## 2020-11-23 DIAGNOSIS — I10 Essential (primary) hypertension: Secondary | ICD-10-CM | POA: Diagnosis not present

## 2020-11-23 DIAGNOSIS — Z789 Other specified health status: Secondary | ICD-10-CM | POA: Diagnosis not present

## 2020-11-23 DIAGNOSIS — Z299 Encounter for prophylactic measures, unspecified: Secondary | ICD-10-CM | POA: Diagnosis not present

## 2021-02-10 DIAGNOSIS — B2700 Gammaherpesviral mononucleosis without complication: Secondary | ICD-10-CM | POA: Diagnosis not present

## 2021-02-10 DIAGNOSIS — E038 Other specified hypothyroidism: Secondary | ICD-10-CM | POA: Diagnosis not present

## 2021-02-10 DIAGNOSIS — E063 Autoimmune thyroiditis: Secondary | ICD-10-CM | POA: Diagnosis not present

## 2021-02-10 DIAGNOSIS — R768 Other specified abnormal immunological findings in serum: Secondary | ICD-10-CM | POA: Diagnosis not present

## 2021-02-10 DIAGNOSIS — E7212 Methylenetetrahydrofolate reductase deficiency: Secondary | ICD-10-CM | POA: Diagnosis not present

## 2021-04-18 DIAGNOSIS — Z1211 Encounter for screening for malignant neoplasm of colon: Secondary | ICD-10-CM | POA: Diagnosis not present

## 2021-04-22 LAB — COLOGUARD: COLOGUARD: NEGATIVE

## 2021-05-09 DIAGNOSIS — Z01419 Encounter for gynecological examination (general) (routine) without abnormal findings: Secondary | ICD-10-CM | POA: Diagnosis not present

## 2021-05-09 DIAGNOSIS — M8580 Other specified disorders of bone density and structure, unspecified site: Secondary | ICD-10-CM | POA: Diagnosis not present

## 2021-05-09 DIAGNOSIS — Z124 Encounter for screening for malignant neoplasm of cervix: Secondary | ICD-10-CM | POA: Diagnosis not present

## 2021-05-09 DIAGNOSIS — Z6823 Body mass index (BMI) 23.0-23.9, adult: Secondary | ICD-10-CM | POA: Diagnosis not present

## 2021-05-09 DIAGNOSIS — N898 Other specified noninflammatory disorders of vagina: Secondary | ICD-10-CM | POA: Diagnosis not present

## 2021-05-15 DIAGNOSIS — E063 Autoimmune thyroiditis: Secondary | ICD-10-CM | POA: Diagnosis not present

## 2021-05-15 DIAGNOSIS — R5382 Chronic fatigue, unspecified: Secondary | ICD-10-CM | POA: Diagnosis not present

## 2021-05-15 DIAGNOSIS — R799 Abnormal finding of blood chemistry, unspecified: Secondary | ICD-10-CM | POA: Diagnosis not present

## 2021-05-15 DIAGNOSIS — B2700 Gammaherpesviral mononucleosis without complication: Secondary | ICD-10-CM | POA: Diagnosis not present

## 2021-05-15 DIAGNOSIS — B960 Mycoplasma pneumoniae [M. pneumoniae] as the cause of diseases classified elsewhere: Secondary | ICD-10-CM | POA: Diagnosis not present

## 2021-05-15 DIAGNOSIS — E039 Hypothyroidism, unspecified: Secondary | ICD-10-CM | POA: Diagnosis not present

## 2021-05-15 DIAGNOSIS — R768 Other specified abnormal immunological findings in serum: Secondary | ICD-10-CM | POA: Diagnosis not present

## 2021-05-15 DIAGNOSIS — I4891 Unspecified atrial fibrillation: Secondary | ICD-10-CM | POA: Diagnosis not present

## 2021-07-10 ENCOUNTER — Encounter (INDEPENDENT_AMBULATORY_CARE_PROVIDER_SITE_OTHER): Payer: Medicare PPO | Admitting: Ophthalmology

## 2021-07-24 ENCOUNTER — Encounter (INDEPENDENT_AMBULATORY_CARE_PROVIDER_SITE_OTHER): Payer: Medicare PPO | Admitting: Ophthalmology

## 2021-07-25 ENCOUNTER — Other Ambulatory Visit: Payer: Self-pay

## 2021-07-25 ENCOUNTER — Ambulatory Visit (INDEPENDENT_AMBULATORY_CARE_PROVIDER_SITE_OTHER): Payer: Medicare PPO | Admitting: Ophthalmology

## 2021-07-25 ENCOUNTER — Encounter (INDEPENDENT_AMBULATORY_CARE_PROVIDER_SITE_OTHER): Payer: Self-pay | Admitting: Ophthalmology

## 2021-07-25 ENCOUNTER — Encounter (INDEPENDENT_AMBULATORY_CARE_PROVIDER_SITE_OTHER): Payer: Medicare PPO | Admitting: Ophthalmology

## 2021-07-25 DIAGNOSIS — H2513 Age-related nuclear cataract, bilateral: Secondary | ICD-10-CM | POA: Diagnosis not present

## 2021-07-25 DIAGNOSIS — H33311 Horseshoe tear of retina without detachment, right eye: Secondary | ICD-10-CM

## 2021-07-25 DIAGNOSIS — H43811 Vitreous degeneration, right eye: Secondary | ICD-10-CM | POA: Diagnosis not present

## 2021-07-25 NOTE — Assessment & Plan Note (Signed)
No new breaks °

## 2021-07-25 NOTE — Progress Notes (Signed)
07/25/2021     CHIEF COMPLAINT Patient presents for  Chief Complaint  Patient presents with   Retina Follow Up      HISTORY OF PRESENT ILLNESS: [name] is a 76 y.o. female who presents to the clinic today for:   HPI     Retina Follow Up           Diagnosis: Other   Laterality: right eye   Onset: 1 year ago   Severity: mild         Comments   1 yr fu OU fp. Patient states near vision has worsened a lot. Pt states her vision in her right eye seems a little bit cloudier. Pt denies new FOL or floaters.      Last edited by Laurin Coder on 07/25/2021  2:40 PM.      Referring physician: Estill Cotta, MD [address]   [place],  [place] [address]  HISTORICAL INFORMATION:   Selected notes from the MEDICAL RECORD NUMBER       CURRENT MEDICATIONS: No current outpatient medications on file. (Ophthalmic Drugs)   No current facility-administered medications for this visit. (Ophthalmic Drugs)   Current Outpatient Medications (Other)  Medication Sig   Acetylcysteine (NAC PO) Take 1 tablet by mouth daily. Blood Orange Complex / Selenium   ALOE VERA PO Take 1 tablet by mouth daily.   Ascorbic Acid (VITAMIN C PO) Take 1 tablet by mouth daily. Lysine   Calcium Carbonate-Vitamin D (CALCIUM-D PO) Take 1 tablet by mouth daily.   Capsicum, Cayenne, (CAYENNE PO) Take 1 tablet by mouth daily.   Cholecalciferol (VITAMIN D PO) Take 1 tablet by mouth daily.   Coenzyme Q10 (COQ10 PO) Take 1 tablet by mouth daily.   Digestive Enzymes (BETAINE HCL PO) Take 1 tablet by mouth daily. Digestion formula.   GARLIC PO Take 1 tablet by mouth daily. Fermented Black   Green Tea, Camillia sinensis, (GREEN TEA PO) Take by mouth daily.   KRILL OIL PO Take 1 tablet by mouth daily.   LevOCARNitine (CARNITINE PO) Take 1 tablet by mouth daily.   MAGNESIUM OXIDE PO Take 1 tablet by mouth daily.   metoprolol tartrate (LOPRESSOR) 50 MG tablet Take 50 mg by mouth at bedtime.    MISC NATURAL PRODUCTS EX Apply topically. Bi-Est 80/20 cream - 5mg /ml - 4/4IH (2 clicks) twice a day   MISC NATURAL PRODUCTS PO Take 1 tablet by mouth at bedtime. Naltrexone 4.5mg    MISC NATURAL PRODUCTS PO Take 1 tablet by mouth daily. T3 T4 compound for thyroid   MISC NATURAL PRODUCTS PO Take 1 tablet by mouth daily. MCT/DHA Collagen Keto Balance   MISC NATURAL PRODUCTS PO Take 1 tablet by mouth daily. Amla Extract   MISC NATURAL PRODUCTS PO Take 1 tablet by mouth daily. AMPK Metabolic Activator - Hesperidin   MISC NATURAL PRODUCTS PO Take 1 tablet by mouth daily. Cruciferous Veggie Extract - Broccoli, 13C, DIM, etc..   MISC NATURAL PRODUCTS PO Take 1 tablet by mouth daily. Omega 7   MISC NATURAL PRODUCTS PO Take 1 tablet by mouth daily. Mushroom Complex   Multiple Vitamin (MULTIVITAMIN) tablet Take 1 tablet by mouth daily. Whole food mineral complex.   NATTOKINASE PO Take 1 tablet by mouth daily.   Nutritional Supplements (ADRENAL COMPLEX PO) Take 1 tablet by mouth daily.   Nutritional Supplements (PYCNOGENOL PO) Take 1 tablet by mouth daily. & gotu Kola   Omega-3 Fatty Acids (EQL OMEGA 3 FISH  OIL PO) Take by mouth daily.     Probiotic Product (PROBIOTIC DAILY PO) Take 1 tablet by mouth daily.   Progesterone Micronized (PROGESTERONE PO) Take 1 tablet by mouth daily.   QUERCETIN PO Take 1 tablet by mouth daily.   rivaroxaban (XARELTO) 20 MG TABS tablet Take 20 mg by mouth daily with supper.   TURMERIC CURCUMIN PO Take 1 tablet by mouth daily.   No current facility-administered medications for this visit. (Other)      REVIEW OF SYSTEMS: ROS   Negative for: Constitutional, Gastrointestinal, Neurological, Skin, Genitourinary, Musculoskeletal, HENT, Endocrine, Cardiovascular, Eyes, Respiratory, Psychiatric, Allergic/Imm, Heme/Lymph Last edited by Hurman Horn, MD on 07/25/2021  3:18 PM.       ALLERGIES Allergies  Allergen Reactions   Penicillins     As a child.   Sulfonamide  Derivatives     PAST MEDICAL HISTORY Past Medical History:  Diagnosis Date   Atrial fibrillation ([place]) 2011   Breast cancer [facility]) 2003   Right lumpectomy and sentinel lymph node biopsy December 2003 for a pT2 pN0, stage IIA  invasive ductal carcinoma, grade 3   Essential hypertension    Thyroid disease    Past Surgical History:  Procedure Laterality Date   BREAST TUMOR  2004   EXCISION MASS LOWER EXTREMETIES Left 12/31/2016   Procedure: EXCISION MASS OF LEFT THIGH;  Surgeon: Cristine Polio, MD;  Location: [place];  Service: Plastics;  Laterality: Left;   LIPOSUCTION Left 12/31/2016   Procedure: LIPOSUCTION ASSISTANCE;  Surgeon: Cristine Polio, MD;  Location: [place];  Service: Plastics;  Laterality: Left;   RECONSTRUCTION OF NOSE  1985   TONSILLECTOMY  1953   TUBAL LIGATION      FAMILY HISTORY Family History  Problem Relation Age of Onset   Stroke Mother    Heart attack Father     SOCIAL HISTORY Social History   Tobacco Use   Smoking status: Never   Smokeless tobacco: Never  Substance Use Topics   Alcohol use: Yes    Alcohol/week: 1.0 standard drink    Types: 1 Glasses of wine per week    Comment: 1-2 glasses daily    Drug use: No         OPHTHALMIC EXAM:  Base Eye Exam     Visual Acuity (ETDRS)       Right Left   Dist [place] 20/25 +2 20/25 +2         Tonometry (Tonopen, 2:41 PM)       Right Left   Pressure 18 16         Pupils       Pupils Dark Light Shape React APD   Right PERRL 6 5 Round Brisk None   Left PERRL 6 5 Round Brisk None         Visual Fields (Counting fingers)       Left Right    Full Full         Extraocular Movement       Right Left    Full Full         Neuro/Psych     Oriented x3: Yes   Mood/Affect: Normal         Dilation     Both eyes: 1.0% Mydriacyl, 2.5% Phenylephrine @ 2:41 PM           Slit Lamp and Fundus Exam     External Exam       Right Left  External Normal Normal         Slit Lamp Exam       Right Left   Lids/Lashes Normal Normal   Conjunctiva/Sclera White and quiet White and quiet   Cornea Clear Clear   Anterior Chamber Deep and quiet Deep and quiet   Iris Round and reactive Round and reactive   Lens  2+ Nuclear sclerosis 2+ Nuclear sclerosis   Anterior Vitreous Normal Normal         Fundus Exam       Right Left   Posterior Vitreous Posterior vitreous detachment Posterior vitreous detachment   Disc Normal Normal   C/D Ratio 0.5 0.5   Macula Normal Normal   Vessels Normal Normal   Periphery Good retinopexy superotemporal, percolated, no new retinal breaks No holes or tears            IMAGING AND PROCEDURES  Imaging and Procedures for 07/25/21  Color Fundus Photography Optos - OU - Both Eyes       Right Eye Progression has been stable. Disc findings include normal observations. Macula : normal observations. Vessels : normal observations. Periphery : normal observations.   Left Eye Progression has been stable. Disc findings include normal observations. Macula : normal observations. Vessels : normal observations. Periphery : normal observations.   Notes OD good laser retinopexy superotemporal, no new breaks             ASSESSMENT/PLAN:  Age-related nuclear cataract of both eyes Minor OU  [place] retinal tear of right eye Good retinopexy superotemporal for old operculated retinal tear right eye, no new breaks  Posterior vitreous detachment of right eye No new breaks     ICD-10-CM   1. [place]hoe retinal tear of right eye  H33.311 Color Fundus Photography Optos - OU - Both Eyes    2. Age-related nuclear cataract of both eyes  H25.13     3. Posterior vitreous detachment of right eye  H43.811       1.  OU stable, no new retinal break  2.  OD history of retinal tear superotemporal quadrant, good retinopexy no new breaks  3.  Progressive NSC changes follow-up with her  ophthalmologist as scheduled  Ophthalmic Meds Ordered this visit:  No orders of the defined types were placed in this encounter.      Return in about 1 year (around 07/25/2022) for DILATE OU, OCT, COLOR FP.  There are no Patient Instructions on file for this visit.   Explained the diagnoses, plan, and follow up with the patient and they expressed understanding.  Patient expressed understanding of the importance of proper follow up care.   Clent Demark Laurelyn Terrero M.D. Diseases & Surgery of the Retina and Vitreous Retina & Diabetic [place] 07/25/21     Abbreviations: M myopia (nearsighted); A astigmatism; H hyperopia (farsighted); P presbyopia; Mrx spectacle prescription;  CTL contact lenses; OD right eye; OS left eye; OU both eyes  XT exotropia; ET esotropia; PEK punctate epithelial keratitis; PEE punctate epithelial erosions; DES dry eye syndrome; MGD meibomian gland dysfunction; ATs artificial tears; PFAT's preservative free artificial tears; [place] nuclear sclerotic cataract; PSC posterior subcapsular cataract; ERM epi-retinal membrane; PVD posterior vitreous detachment; RD retinal detachment; DM diabetes mellitus; DR diabetic retinopathy; NPDR non-proliferative diabetic retinopathy; PDR proliferative diabetic retinopathy; CSME clinically significant macular edema; DME diabetic macular edema; dbh dot blot hemorrhages; CWS cotton wool spot; POAG primary open angle glaucoma; C/D cup-to-disc ratio; HVF humphrey visual field; GVF goldmann visual field; OCT  optical coherence tomography; IOP intraocular pressure; BRVO Branch retinal vein occlusion; CRVO central retinal vein occlusion; CRAO central retinal artery occlusion; BRAO branch retinal artery occlusion; RT retinal tear; SB scleral buckle; PPV pars plana vitrectomy; VH Vitreous hemorrhage; PRP panretinal laser photocoagulation; IVK intravitreal kenalog; VMT vitreomacular traction; MH Macular hole;  NVD neovascularization of the disc; NVE  neovascularization elsewhere; AREDS age related eye disease study; ARMD age related macular degeneration; POAG primary open angle glaucoma; EBMD epithelial/anterior basement membrane dystrophy; ACIOL anterior chamber intraocular lens; IOL intraocular lens; PCIOL posterior chamber intraocular lens; Phaco/IOL phacoemulsification with intraocular lens placement; [place] photorefractive keratectomy; LASIK laser assisted in situ keratomileusis; HTN hypertension; DM diabetes mellitus; COPD chronic obstructive pulmonary disease

## 2021-07-25 NOTE — Assessment & Plan Note (Signed)
Good retinopexy superotemporal for old operculated retinal tear right eye, no new breaks

## 2021-07-25 NOTE — Assessment & Plan Note (Signed)
Minor OU 

## 2021-08-02 ENCOUNTER — Telehealth: Payer: Self-pay | Admitting: Adult Health

## 2021-08-02 NOTE — Telephone Encounter (Signed)
Called patient to update her on the changes made to her upcoming appointment. Left message. Patient will be mailed an updated calendar.

## 2021-08-16 DIAGNOSIS — Z299 Encounter for prophylactic measures, unspecified: Secondary | ICD-10-CM | POA: Diagnosis not present

## 2021-08-16 DIAGNOSIS — I10 Essential (primary) hypertension: Secondary | ICD-10-CM | POA: Diagnosis not present

## 2021-08-16 DIAGNOSIS — Z7189 Other specified counseling: Secondary | ICD-10-CM | POA: Diagnosis not present

## 2021-08-16 DIAGNOSIS — R3989 Other symptoms and signs involving the genitourinary system: Secondary | ICD-10-CM | POA: Diagnosis not present

## 2021-08-16 DIAGNOSIS — Z1331 Encounter for screening for depression: Secondary | ICD-10-CM | POA: Diagnosis not present

## 2021-08-16 DIAGNOSIS — D6869 Other thrombophilia: Secondary | ICD-10-CM | POA: Diagnosis not present

## 2021-08-16 DIAGNOSIS — Z0000 Encounter for general adult medical examination without abnormal findings: Secondary | ICD-10-CM | POA: Diagnosis not present

## 2021-08-16 DIAGNOSIS — Z1339 Encounter for screening examination for other mental health and behavioral disorders: Secondary | ICD-10-CM | POA: Diagnosis not present

## 2021-08-16 DIAGNOSIS — I4891 Unspecified atrial fibrillation: Secondary | ICD-10-CM | POA: Diagnosis not present

## 2021-08-25 DIAGNOSIS — D2272 Melanocytic nevi of left lower limb, including hip: Secondary | ICD-10-CM | POA: Diagnosis not present

## 2021-08-25 DIAGNOSIS — D2261 Melanocytic nevi of right upper limb, including shoulder: Secondary | ICD-10-CM | POA: Diagnosis not present

## 2021-08-25 DIAGNOSIS — L821 Other seborrheic keratosis: Secondary | ICD-10-CM | POA: Diagnosis not present

## 2021-08-25 DIAGNOSIS — D225 Melanocytic nevi of trunk: Secondary | ICD-10-CM | POA: Diagnosis not present

## 2021-08-25 DIAGNOSIS — D2262 Melanocytic nevi of left upper limb, including shoulder: Secondary | ICD-10-CM | POA: Diagnosis not present

## 2021-08-25 DIAGNOSIS — Z85828 Personal history of other malignant neoplasm of skin: Secondary | ICD-10-CM | POA: Diagnosis not present

## 2021-09-01 DIAGNOSIS — I87393 Chronic venous hypertension (idiopathic) with other complications of bilateral lower extremity: Secondary | ICD-10-CM | POA: Diagnosis not present

## 2021-09-01 DIAGNOSIS — I872 Venous insufficiency (chronic) (peripheral): Secondary | ICD-10-CM | POA: Diagnosis not present

## 2021-10-03 DIAGNOSIS — I83813 Varicose veins of bilateral lower extremities with pain: Secondary | ICD-10-CM | POA: Diagnosis not present

## 2021-10-03 DIAGNOSIS — M7989 Other specified soft tissue disorders: Secondary | ICD-10-CM | POA: Diagnosis not present

## 2021-10-03 DIAGNOSIS — I781 Nevus, non-neoplastic: Secondary | ICD-10-CM | POA: Diagnosis not present

## 2021-10-03 DIAGNOSIS — I872 Venous insufficiency (chronic) (peripheral): Secondary | ICD-10-CM | POA: Diagnosis not present

## 2021-10-10 DIAGNOSIS — I87392 Chronic venous hypertension (idiopathic) with other complications of left lower extremity: Secondary | ICD-10-CM | POA: Diagnosis not present

## 2021-10-10 DIAGNOSIS — I83892 Varicose veins of left lower extremities with other complications: Secondary | ICD-10-CM | POA: Diagnosis not present

## 2021-10-30 ENCOUNTER — Inpatient Hospital Stay: Payer: Medicare PPO | Admitting: Adult Health

## 2021-10-30 ENCOUNTER — Encounter: Payer: Medicare PPO | Admitting: Adult Health

## 2021-11-02 DIAGNOSIS — I83892 Varicose veins of left lower extremities with other complications: Secondary | ICD-10-CM | POA: Diagnosis not present

## 2021-11-02 DIAGNOSIS — I872 Venous insufficiency (chronic) (peripheral): Secondary | ICD-10-CM | POA: Diagnosis not present

## 2021-11-07 DIAGNOSIS — I83891 Varicose veins of right lower extremities with other complications: Secondary | ICD-10-CM | POA: Diagnosis not present

## 2021-11-07 DIAGNOSIS — I87391 Chronic venous hypertension (idiopathic) with other complications of right lower extremity: Secondary | ICD-10-CM | POA: Diagnosis not present

## 2021-12-01 ENCOUNTER — Telehealth: Payer: Self-pay | Admitting: *Deleted

## 2021-12-08 ENCOUNTER — Inpatient Hospital Stay: Payer: Medicare PPO | Attending: Adult Health | Admitting: Adult Health

## 2021-12-20 DIAGNOSIS — R799 Abnormal finding of blood chemistry, unspecified: Secondary | ICD-10-CM | POA: Diagnosis not present

## 2021-12-20 DIAGNOSIS — Z719 Counseling, unspecified: Secondary | ICD-10-CM | POA: Diagnosis not present

## 2021-12-20 DIAGNOSIS — R5382 Chronic fatigue, unspecified: Secondary | ICD-10-CM | POA: Diagnosis not present

## 2021-12-20 DIAGNOSIS — B2790 Infectious mononucleosis, unspecified without complication: Secondary | ICD-10-CM | POA: Diagnosis not present

## 2021-12-20 DIAGNOSIS — E063 Autoimmune thyroiditis: Secondary | ICD-10-CM | POA: Diagnosis not present

## 2021-12-20 DIAGNOSIS — Z0000 Encounter for general adult medical examination without abnormal findings: Secondary | ICD-10-CM | POA: Diagnosis not present

## 2021-12-20 DIAGNOSIS — Z780 Asymptomatic menopausal state: Secondary | ICD-10-CM | POA: Diagnosis not present

## 2021-12-20 DIAGNOSIS — I4891 Unspecified atrial fibrillation: Secondary | ICD-10-CM | POA: Diagnosis not present

## 2021-12-20 DIAGNOSIS — D510 Vitamin B12 deficiency anemia due to intrinsic factor deficiency: Secondary | ICD-10-CM | POA: Diagnosis not present

## 2022-02-20 DIAGNOSIS — I872 Venous insufficiency (chronic) (peripheral): Secondary | ICD-10-CM | POA: Diagnosis not present

## 2022-02-20 DIAGNOSIS — I781 Nevus, non-neoplastic: Secondary | ICD-10-CM | POA: Diagnosis not present

## 2022-02-20 DIAGNOSIS — I87393 Chronic venous hypertension (idiopathic) with other complications of bilateral lower extremity: Secondary | ICD-10-CM | POA: Diagnosis not present

## 2022-03-21 DIAGNOSIS — H524 Presbyopia: Secondary | ICD-10-CM | POA: Diagnosis not present

## 2022-03-21 DIAGNOSIS — H2513 Age-related nuclear cataract, bilateral: Secondary | ICD-10-CM | POA: Diagnosis not present

## 2022-04-02 DIAGNOSIS — Z1231 Encounter for screening mammogram for malignant neoplasm of breast: Secondary | ICD-10-CM | POA: Diagnosis not present

## 2022-05-09 DIAGNOSIS — I10 Essential (primary) hypertension: Secondary | ICD-10-CM | POA: Diagnosis not present

## 2022-05-09 DIAGNOSIS — R5383 Other fatigue: Secondary | ICD-10-CM | POA: Diagnosis not present

## 2022-05-09 DIAGNOSIS — Z299 Encounter for prophylactic measures, unspecified: Secondary | ICD-10-CM | POA: Diagnosis not present

## 2022-05-10 DIAGNOSIS — R5383 Other fatigue: Secondary | ICD-10-CM | POA: Diagnosis not present

## 2022-05-10 DIAGNOSIS — I10 Essential (primary) hypertension: Secondary | ICD-10-CM | POA: Diagnosis not present

## 2022-05-10 DIAGNOSIS — Z299 Encounter for prophylactic measures, unspecified: Secondary | ICD-10-CM | POA: Diagnosis not present

## 2022-05-10 DIAGNOSIS — Z713 Dietary counseling and surveillance: Secondary | ICD-10-CM | POA: Diagnosis not present

## 2022-05-16 DIAGNOSIS — K219 Gastro-esophageal reflux disease without esophagitis: Secondary | ICD-10-CM | POA: Diagnosis not present

## 2022-05-16 DIAGNOSIS — E039 Hypothyroidism, unspecified: Secondary | ICD-10-CM | POA: Diagnosis not present

## 2022-05-16 DIAGNOSIS — Z299 Encounter for prophylactic measures, unspecified: Secondary | ICD-10-CM | POA: Diagnosis not present

## 2022-05-16 DIAGNOSIS — I10 Essential (primary) hypertension: Secondary | ICD-10-CM | POA: Diagnosis not present

## 2022-06-07 DIAGNOSIS — Z1211 Encounter for screening for malignant neoplasm of colon: Secondary | ICD-10-CM | POA: Diagnosis not present

## 2022-06-07 DIAGNOSIS — M8580 Other specified disorders of bone density and structure, unspecified site: Secondary | ICD-10-CM | POA: Diagnosis not present

## 2022-06-07 DIAGNOSIS — Z124 Encounter for screening for malignant neoplasm of cervix: Secondary | ICD-10-CM | POA: Diagnosis not present

## 2022-06-07 DIAGNOSIS — Z01419 Encounter for gynecological examination (general) (routine) without abnormal findings: Secondary | ICD-10-CM | POA: Diagnosis not present

## 2022-06-07 DIAGNOSIS — Z6822 Body mass index (BMI) 22.0-22.9, adult: Secondary | ICD-10-CM | POA: Diagnosis not present

## 2022-06-07 DIAGNOSIS — Z1231 Encounter for screening mammogram for malignant neoplasm of breast: Secondary | ICD-10-CM | POA: Diagnosis not present

## 2022-06-10 DIAGNOSIS — R030 Elevated blood-pressure reading, without diagnosis of hypertension: Secondary | ICD-10-CM | POA: Diagnosis not present

## 2022-06-10 DIAGNOSIS — J00 Acute nasopharyngitis [common cold]: Secondary | ICD-10-CM | POA: Diagnosis not present

## 2022-06-10 DIAGNOSIS — Z6823 Body mass index (BMI) 23.0-23.9, adult: Secondary | ICD-10-CM | POA: Diagnosis not present

## 2022-06-20 DIAGNOSIS — R799 Abnormal finding of blood chemistry, unspecified: Secondary | ICD-10-CM | POA: Diagnosis not present

## 2022-06-20 DIAGNOSIS — D510 Vitamin B12 deficiency anemia due to intrinsic factor deficiency: Secondary | ICD-10-CM | POA: Diagnosis not present

## 2022-06-20 DIAGNOSIS — I4891 Unspecified atrial fibrillation: Secondary | ICD-10-CM | POA: Diagnosis not present

## 2022-06-20 DIAGNOSIS — R5382 Chronic fatigue, unspecified: Secondary | ICD-10-CM | POA: Diagnosis not present

## 2022-06-20 DIAGNOSIS — R5383 Other fatigue: Secondary | ICD-10-CM | POA: Diagnosis not present

## 2022-06-20 DIAGNOSIS — Z780 Asymptomatic menopausal state: Secondary | ICD-10-CM | POA: Diagnosis not present

## 2022-06-20 DIAGNOSIS — Z0000 Encounter for general adult medical examination without abnormal findings: Secondary | ICD-10-CM | POA: Diagnosis not present

## 2022-06-20 DIAGNOSIS — E559 Vitamin D deficiency, unspecified: Secondary | ICD-10-CM | POA: Diagnosis not present

## 2022-06-20 DIAGNOSIS — E063 Autoimmune thyroiditis: Secondary | ICD-10-CM | POA: Diagnosis not present

## 2022-06-22 DIAGNOSIS — I10 Essential (primary) hypertension: Secondary | ICD-10-CM | POA: Diagnosis not present

## 2022-06-22 DIAGNOSIS — Z299 Encounter for prophylactic measures, unspecified: Secondary | ICD-10-CM | POA: Diagnosis not present

## 2022-06-22 DIAGNOSIS — J069 Acute upper respiratory infection, unspecified: Secondary | ICD-10-CM | POA: Diagnosis not present

## 2022-06-29 DIAGNOSIS — Z299 Encounter for prophylactic measures, unspecified: Secondary | ICD-10-CM | POA: Diagnosis not present

## 2022-06-29 DIAGNOSIS — I10 Essential (primary) hypertension: Secondary | ICD-10-CM | POA: Diagnosis not present

## 2022-06-29 DIAGNOSIS — K219 Gastro-esophageal reflux disease without esophagitis: Secondary | ICD-10-CM | POA: Diagnosis not present

## 2022-07-02 ENCOUNTER — Ambulatory Visit: Payer: Medicare PPO | Admitting: Cardiology

## 2022-07-03 DIAGNOSIS — K219 Gastro-esophageal reflux disease without esophagitis: Secondary | ICD-10-CM | POA: Diagnosis not present

## 2022-07-09 ENCOUNTER — Ambulatory Visit: Payer: Medicare PPO | Admitting: Cardiovascular Disease

## 2022-07-26 ENCOUNTER — Encounter (INDEPENDENT_AMBULATORY_CARE_PROVIDER_SITE_OTHER): Payer: Medicare PPO | Admitting: Ophthalmology

## 2022-07-30 ENCOUNTER — Ambulatory Visit: Payer: Medicare PPO | Admitting: Physician Assistant

## 2022-07-31 ENCOUNTER — Encounter (INDEPENDENT_AMBULATORY_CARE_PROVIDER_SITE_OTHER): Payer: Medicare PPO | Admitting: Ophthalmology

## 2022-08-01 ENCOUNTER — Encounter: Payer: Self-pay | Admitting: Cardiology

## 2022-08-01 ENCOUNTER — Ambulatory Visit: Payer: Medicare PPO | Attending: Cardiovascular Disease | Admitting: Cardiology

## 2022-08-01 VITALS — BP 158/70 | HR 69 | Ht 69.0 in | Wt 157.0 lb

## 2022-08-01 DIAGNOSIS — I10 Essential (primary) hypertension: Secondary | ICD-10-CM | POA: Diagnosis not present

## 2022-08-01 DIAGNOSIS — I4891 Unspecified atrial fibrillation: Secondary | ICD-10-CM | POA: Diagnosis not present

## 2022-08-01 DIAGNOSIS — I4819 Other persistent atrial fibrillation: Secondary | ICD-10-CM | POA: Diagnosis not present

## 2022-08-01 NOTE — Progress Notes (Signed)
Cardiology Office Note  Date: 08/01/2022   ID: Teshawn, Bojorquez July 28, 1945, MRN [id number]  PCP:  Glenda Chroman, MD  Cardiologist:  Rozann Lesches, MD Electrophysiologist:  None   Chief Complaint  Patient presents with   Concerns about blood pressure and atrial fibrillation    History of Present Illness: KAYLIANI TOBER is a 77 y.o. female that I saw once in the office back in 2018, now referred back by PCP for follow-up of atrial fibrillation and blood pressure.  She has a history of persistent atrial fibrillation and essential hypertension, in general has preferred a fairly holistic approach to her health and general avoidance of prescription medications on a consistent basis.  This was the case in 2018 when she was evaluated at which point she declined both heart rate control strategy and anticoagulation.  Since that time she has been following more recently with Dr. Woody Seller.  She tells me that about a year ago she was started on Xarelto and agreed to take this for stroke prophylaxis.  Within this general time course she was also started on Toprol-XL presumably for heart rate control also treatment of hypertension, was given as needed hydralazine for spikes in blood pressure.  She has not been consistently taking Toprol-XL.  She does not report any sense of palpitations, her heart rate is controlled in atrial fibrillation today confirmed by ECG.  She states that she takes Toprol-XL typically in the evening and has also been consistent with Xarelto.  I did review her most recent lab work per PCP which is noted below.  As far as blood pressure is concerned, she checks this usually in the evenings.  We went over her home blood pressure checks, perhaps once every few weeks she has a spike in her blood pressure documented with systolics as high as the [id number].  She is most concerned about the etiology of the spike, it is not entirely clear based on review of the situation and discussion today.  I  do wonder if inconsistency with use of her Toprol-XL could be a contributor.  She is concerned that it tends to happen after meals, is concerned about possible relation to her hiatal hernia.  She does not report any regular reflux or abdominal pain however.  She does have gastroenterology consultation pending.  She also mentions that she has no functional/physical limitations, exercises regularly, no unusual breathlessness, no dizziness or syncope.  I reviewed the remainder of her medications, she is also on a number of supplements as noted below.  Past Medical History:  Diagnosis Date   Atrial fibrillation ([place]) 2011   Breast cancer [facility]) 2003   Right lumpectomy and sentinel lymph node biopsy December 2003 for a pT2 pN0, stage IIA  invasive ductal carcinoma, grade 3   Cataracts, bilateral    Essential hypertension    Thyroid disease    Venous insufficiency     Past Surgical History:  Procedure Laterality Date   BREAST TUMOR  2004   EXCISION MASS LOWER EXTREMETIES Left 12/31/2016   Procedure: EXCISION MASS OF LEFT THIGH;  Surgeon: Cristine Polio, MD;  Location: [facility];  Service: Plastics;  Laterality: Left;   LIPOSUCTION Left 12/31/2016   Procedure: LIPOSUCTION ASSISTANCE;  Surgeon: Cristine Polio, MD;  Location: [facility];  Service: Plastics;  Laterality: Left;   RECONSTRUCTION OF NOSE  1985   TONSILLECTOMY  1953   TUBAL LIGATION      Current Outpatient Medications  Medication Sig  Dispense Refill   Acetylcysteine (NAC PO) Take 1 tablet by mouth daily. Blood Orange Complex / Selenium     ALOE VERA PO Take 1 tablet by mouth daily.     Ascorbic Acid (VITAMIN C PO) Take 1 tablet by mouth daily. Lysine     Calcium Carbonate-Vitamin D (CALCIUM-D PO) Take 1 tablet by mouth daily.     Capsicum, Cayenne, (CAYENNE PO) Take 1 tablet by mouth daily.     Cholecalciferol (VITAMIN D PO) Take 1 tablet by mouth daily.     Coenzyme Q10 (COQ10 PO) Take 1 tablet  by mouth daily.     Digestive Enzymes (BETAINE HCL PO) Take 1 tablet by mouth daily. Digestion formula.     GARLIC PO Take 1 tablet by mouth daily. Fermented Black     Green Tea, Camillia sinensis, (GREEN TEA PO) Take by mouth daily.     hydrALAZINE (APRESOLINE) 25 MG tablet Take 25 mg by mouth as needed (elevated bp).     ivermectin (STROMECTOL) 3 MG TABS tablet 2 (two) times a week.     KRILL OIL PO Take 1 tablet by mouth daily.     LevOCARNitine (CARNITINE PO) Take 1 tablet by mouth daily.     levothyroxine (SYNTHROID) 137 MCG tablet Take 137 mcg by mouth every morning.     liothyronine (CYTOMEL) 5 MCG tablet Take 5 mcg by mouth every morning.     MAGNESIUM OXIDE PO Take 1 tablet by mouth daily.     metoprolol succinate (TOPROL-XL) 50 MG 24 hr tablet Take 50 mg by mouth daily.     MISC NATURAL PRODUCTS EX Apply topically. Bi-Est 80/20 cream - 67m/ml - 1123456(2 clicks) twice a day     MISC NATURAL PRODUCTS PO Take 1 tablet by mouth at bedtime. Naltrexone 4.517m    MISC NATURAL PRODUCTS PO Take 1 tablet by mouth daily. T3 T4 compound for thyroid     MISC NATURAL PRODUCTS PO Take 1 tablet by mouth daily. MCT/DHA Collagen Keto Balance     MISC NATURAL PRODUCTS PO Take 1 tablet by mouth daily. Amla Extract     MISC NATURAL PRODUCTS PO Take 1 tablet by mouth daily. AMPK Metabolic Activator - Hesperidin     MISC NATURAL PRODUCTS PO Take 1 tablet by mouth daily. Cruciferous Veggie Extract - Broccoli, 13C, DIM, etc..     MISC NATURAL PRODUCTS PO Take 1 tablet by mouth daily. Omega 7     MISC NATURAL PRODUCTS PO Take 1 tablet by mouth daily. Mushroom Complex     Multiple Vitamin (MULTIVITAMIN) tablet Take 1 tablet by mouth daily. Whole food mineral complex.     Naltrexone HCl (REVIA PO) daily at 6 (six) AM.     NATTOKINASE PO Take 1 tablet by mouth daily.     Nutritional Supplements (ADRENAL COMPLEX PO) Take 1 tablet by mouth daily.     Nutritional Supplements (PYCNOGENOL PO) Take 1 tablet by mouth  daily. & gotu Kola     Omega-3 Fatty Acids (EQL OMEGA 3 FISH OIL PO) Take by mouth daily.       pantoprazole (PROTONIX) 40 MG tablet Take 40 mg by mouth as needed (heartburn).     Probiotic Product (PROBIOTIC DAILY PO) Take 1 tablet by mouth daily.     Progesterone Micronized (PROGESTERONE PO) Take 1 tablet by mouth daily.     QUERCETIN PO Take 1 tablet by mouth daily.     rivaroxaban (XARELTO) 20 MG TABS  tablet Take 20 mg by mouth daily with supper.     TURMERIC CURCUMIN PO Take 1 tablet by mouth daily.     No current facility-administered medications for this visit.   Allergies:  Penicillins and Sulfonamide derivatives   Social History: The patient  reports that she has never smoked. She has never used smokeless tobacco. She reports current alcohol use of about 1.0 standard drink of alcohol per week. She reports that she does not use drugs.   Family History: The patient's family history includes Heart attack in her father; Stroke in her mother.   ROS: No orthopnea or PND.  No leg swelling.  Physical Exam: VS:  BP (!) 158/70   Pulse 69   Ht 5' 9"$  (1.753 m)   Wt 157 lb (71.2 kg)   SpO2 99%   BMI 23.18 kg/m , BMI Body mass index is 23.18 kg/m.  Wt Readings from Last 3 Encounters:  08/01/22 157 lb (71.2 kg)  11/01/20 154 lb (69.9 kg)  07/28/19 154 lb 14.4 oz (70.3 kg)    General: Patient appears comfortable at rest. HEENT: Conjunctiva and lids normal. Neck: Supple, no elevated JVP or carotid bruits. Lungs: Clear to auscultation, nonlabored breathing at rest. Cardiac: Irregularly irregular without significant murmur or gallop, no pericardial rub. Abdomen: Bowel sounds present. Extremities: No pitting edema. Skin: Warm and dry. Musculoskeletal: No kyphosis. Neuropsychiatric: Alert and oriented x3, affect grossly appropriate.  ECG:  An ECG dated 03/18/2017 was personally reviewed today and demonstrated:  Atrial fibrillation with RVR and nonspecific ST-T changes.  Recent  Labwork:  January 2024: Hemoglobin 14.6, platelets 336, BUN 8, creatinine 0.65, potassium 4.6, AST 22, ALT 16, cholesterol 185, triglycerides 66, HDL 49, LDL 124, TSH 2.56, hemoglobin A1c 5.9%  Other Studies Reviewed Today:  Echocardiogram 01/15/2017 [facility]): LVEF 55-60%, mildly dilated left atrium, normal right ventricular contraction, mildly thickened aortic leaflets, trace mitral regurgitation, RVSP estimated 34 mmHg, no pericardial effusion.   Assessment and Plan:  1.  Persistent atrial fibrillation.  She states that her Apple Watch indicates that she is out of rhythm at least 60% of the time, I do wonder whether this is permanent atrial fibrillation however based on chronicity.  Her CHA2DS2-VASc score is 4, and I agree with anticoagulation for stroke prophylaxis.  She states that she is tolerating Xarelto, no spontaneous bleeding problems.  I did review her recent lab work.  Would recommend consistent use of Toprol-XL for heart rate control, this may also provide a better assessment of what her actual blood pressure trend is over time.  2.  Essential hypertension with reportedly fluctuating blood pressures.  As discussed above would recommend consistent use of Toprol-XL at 50 mg daily for now.  Track blood pressure and can then determine if additional standing agent is necessary.  She has hydralazine available as needed as prescribed by Dr. Woody Seller otherwise.  We did discuss her diet and sodium intake which sound fairly well-managed.  She also exercises regularly.  3.  Patient states that she has a hiatal hernia, does not report any major reflux or abdominal pain, no dysphagia.  Not clear based on description that this has any relation to her blood pressure, but she has been concerned about this.  She has pantoprazole which she takes as needed.  Follow-up pending with gastroenterology for further discussion.  Medication Adjustments/Labs and Tests Ordered: Current medicines are  reviewed at length with the patient today.  Concerns regarding medicines are outlined above.  Tests Ordered: Orders Placed This Encounter  Procedures   EKG 12-Lead    Medication Changes: No orders of the defined types were placed in this encounter.   Disposition:  Follow up  1 month.  Signed, Satira Sark, MD, [facility] 08/01/2022 5:39 PM    [place] [facility] [address] [place], [place] [address] Phone: [phone]; Fax: [phone]

## 2022-08-01 NOTE — Patient Instructions (Addendum)
Medication Instructions:  Your physician recommends that you continue on your current medications as directed. Please refer to the Current Medication list given to you today.  Labwork: None  Testing/Procedures: None  Follow-Up: Follow up with Dr. Domenic Polite in 1 month.   Any Other Special Instructions Will Be Listed Below (If Applicable).  Your physician has requested that you regularly monitor and record your blood pressure readings at home. Please use the same machine at the same time of day to check your readings and record them to bring to your follow-up visit.    If you need a refill on your cardiac medications before your next appointment, please call your pharmacy.

## 2022-08-02 ENCOUNTER — Telehealth: Payer: Self-pay | Admitting: Cardiology

## 2022-08-02 NOTE — Telephone Encounter (Signed)
EKG mailed to pt

## 2022-08-02 NOTE — Telephone Encounter (Signed)
New Message:      Patient said she had an EKG  yesterday when she saw Dr Domenic Polite. She would like a copy of that EKG mailed to her please.

## 2022-08-06 DIAGNOSIS — H33311 Horseshoe tear of retina without detachment, right eye: Secondary | ICD-10-CM | POA: Diagnosis not present

## 2022-08-06 DIAGNOSIS — H43811 Vitreous degeneration, right eye: Secondary | ICD-10-CM | POA: Diagnosis not present

## 2022-08-06 DIAGNOSIS — H2513 Age-related nuclear cataract, bilateral: Secondary | ICD-10-CM | POA: Diagnosis not present

## 2022-08-21 DIAGNOSIS — Z299 Encounter for prophylactic measures, unspecified: Secondary | ICD-10-CM | POA: Diagnosis not present

## 2022-08-21 DIAGNOSIS — E039 Hypothyroidism, unspecified: Secondary | ICD-10-CM | POA: Diagnosis not present

## 2022-08-21 DIAGNOSIS — Z7189 Other specified counseling: Secondary | ICD-10-CM | POA: Diagnosis not present

## 2022-08-21 DIAGNOSIS — Z0000 Encounter for general adult medical examination without abnormal findings: Secondary | ICD-10-CM | POA: Diagnosis not present

## 2022-08-21 DIAGNOSIS — Z1339 Encounter for screening examination for other mental health and behavioral disorders: Secondary | ICD-10-CM | POA: Diagnosis not present

## 2022-08-21 DIAGNOSIS — E7800 Pure hypercholesterolemia, unspecified: Secondary | ICD-10-CM | POA: Diagnosis not present

## 2022-08-21 DIAGNOSIS — Z79899 Other long term (current) drug therapy: Secondary | ICD-10-CM | POA: Diagnosis not present

## 2022-08-21 DIAGNOSIS — Z1331 Encounter for screening for depression: Secondary | ICD-10-CM | POA: Diagnosis not present

## 2022-08-21 DIAGNOSIS — R5383 Other fatigue: Secondary | ICD-10-CM | POA: Diagnosis not present

## 2022-08-21 DIAGNOSIS — I10 Essential (primary) hypertension: Secondary | ICD-10-CM | POA: Diagnosis not present

## 2022-08-23 ENCOUNTER — Ambulatory Visit: Payer: Medicare PPO | Admitting: Cardiology

## 2022-08-28 ENCOUNTER — Ambulatory Visit: Payer: Medicare PPO | Admitting: Nurse Practitioner

## 2022-08-28 NOTE — Progress Notes (Signed)
09/05/2022 [name] [id number] 1945-09-12  Referring provider: Bryson Corona, NP Primary GI doctor: Dr. Silverio Decamp (Dr. Silverio Decamp follows with her husband)  ASSESSMENT AND PLAN:  Esophageal dysphagia with history of hiatal hernia Discussed possibly doing upper GI versus endoscopy. Patient prefers not to have radiation exposure. Patient is also very holistic and prefers not to get on medications if not needed. After lengthy discussion, decided together to proceed with upper endoscopy for worsening esophageal dysphagia and evaluation of hiatal hernia. EGD with dilatation to evaluate for stenosis, tumor, erosive/infectious esophagititis, and EOE.   I discussed risks of EGD with patient today, including risk of sedation, bleeding or perforation.  Patient provides understanding and gave verbal consent to proceed. Discussed she may want to evaluate some of her supplements and stop or decrease those, turmeric is very common for causing reflux or issues.  Chronic atrial fibrillation [name] Patient told to hold her Xarelto for 2 days prior to time of procedure. Will instruct when and how to resume after procedure. We will communicate with her prescribing physician Dr. Woody Seller to ensure that holding his Xarelto is acceptable. We discussed the risk, benefits and alternatives to colonoscopy/endoscopy.  We also discussed the low but real risk of cardiovascular event such as heart attack, stroke, embolism, thrombosis or ischemia/infarct of other organs off Xarelto and explained the need to seek urgent help if this occurs.  She is agreeable and wishes to proceed.  Screen for colon cancer Normal cologuard 2022, declines colonoscopy   Patient Care Team: Glenda Chroman, MD as PCP - General (Internal Medicine) Satira Sark, MD as PCP - Cardiology (Cardiology) Haygood, Seymour Bars, MD (Inactive) (Obstetrics and Gynecology) Magrinat, Virgie Dad, MD (Inactive) as Consulting Physician  (Oncology) Glenda Chroman, MD as Referring Physician (Internal Medicine)  HISTORY OF PRESENT ILLNESS: 77 y.o. female with a past medical history of hypertension, atrial fibrillation on Xarelto, history of breast cancer 2003 status post right lumpectomy and others listed below presents for evaluation of hiatal hernia.   03/05/2018 and 04/22/2021 Cologuard negative Counseled psychotherapist, has done preventative. She states she was told she has hiatal hernia years ago. After eating can have epigastric fullness, can have dysphagia with supplements, getting caught more frequently.  She denies a lot of GERD, has very rare. Occ clearing of her throat but does not bother her.  Denies nausea, vomiting, epigastric pain, weight loss.  She takes probiotics and enzymes.  She states she has blood work done twice a year for preventive with [facility], on bio identical hormones.  Patient states she has had bowel movements daily, no melena or hematochezia. Denies NSAIDs, tobacco use, rare alcohol, no drug use.  She  reports that she has never smoked. She has never used smokeless tobacco. She reports current alcohol use of about 1.0 standard drink of alcohol per week. She reports that she does not use drugs.  RELEVANT LABS AND IMAGING: CBC    Component Value Date/Time   WBC 7.7 08/20/2008 0227   RBC 4.20 08/20/2008 0227   HGB 14.1 08/20/2008 0227   HCT 40.2 08/20/2008 0227   PLT 312 08/20/2008 0227   MCV 95.6 08/20/2008 0227   MCHC 35.1 08/20/2008 0227   RDW 11.7 08/20/2008 0227   LYMPHSABS 1.8 08/20/2008 0227   MONOABS 0.7 08/20/2008 0227   EOSABS 0.1 08/20/2008 0227   BASOSABS 0.0 08/20/2008 0227   No results for input(s): "HGB" in the last 8760 hours.   CMP  Component Value Date/Time   NA 140 08/20/2008 0227   K 3.6 08/20/2008 0227   CL 106 08/20/2008 0227   CO2 27 08/20/2008 0227   GLUCOSE 120 (H) 08/20/2008 0227   BUN 8 08/20/2008 0227   CREATININE 0.60 08/20/2008  0227   CALCIUM 9.4 08/20/2008 0227   PROT 7.3 11/06/2007 2201   ALBUMIN 4.2 11/06/2007 2201   AST 29 11/06/2007 2201   ALT 21 11/06/2007 2201   ALKPHOS 90 11/06/2007 2201   BILITOT 0.7 11/06/2007 2201   GFRNONAA >60 08/20/2008 0227   GFRAA  08/20/2008 0227    >60        The eGFR has been calculated using the MDRD equation. This calculation has not been validated in all clinical situations. eGFR's persistently <60 mL/min signify possible Chronic Kidney Disease.      11/06/2007   10:01 PM  Hepatic Function  Total Protein 7.3   Albumin 4.2   AST 29   ALT 21   Alk Phosphatase 90   Total Bilirubin 0.7       Current Medications:   Current Outpatient Medications (Endocrine & Metabolic):    levothyroxine (SYNTHROID) 137 MCG tablet, Take 137 mcg by mouth every morning.   liothyronine (CYTOMEL) 5 MCG tablet, Take 5 mcg by mouth every morning.   Progesterone Micronized (PROGESTERONE PO), Take 1 tablet by mouth daily.  Current Outpatient Medications (Cardiovascular):    hydrALAZINE (APRESOLINE) 25 MG tablet, Take 25 mg by mouth as needed (elevated bp).   metoprolol succinate (TOPROL-XL) 50 MG 24 hr tablet, Take 50 mg by mouth daily.    Current Outpatient Medications (Hematological):    rivaroxaban (XARELTO) 20 MG TABS tablet, Take 20 mg by mouth daily with supper.  Current Outpatient Medications (Other):    Acetylcysteine (NAC PO), Take 1 tablet by mouth daily. Blood Orange Complex / Selenium   ALOE VERA PO, Take 1 tablet by mouth daily.   Ascorbic Acid (VITAMIN C PO), Take 1 tablet by mouth daily. Lysine   Calcium Carbonate-Vitamin D (CALCIUM-D PO), Take 1 tablet by mouth daily.   Capsicum, Cayenne, (CAYENNE PO), Take 1 tablet by mouth daily.   Cholecalciferol (VITAMIN D PO), Take 1 tablet by mouth daily.   Coenzyme Q10 (COQ10 PO), Take 1 tablet by mouth daily.   Digestive Enzymes (BETAINE HCL PO), Take 1 tablet by mouth daily. Digestion formula.   GARLIC PO, Take 1  tablet by mouth daily. Fermented Black   Green Tea, Camillia sinensis, (GREEN TEA PO), Take by mouth daily.   ivermectin (STROMECTOL) 3 MG TABS tablet, 2 (two) times a week.   KRILL OIL PO, Take 1 tablet by mouth daily.   MAGNESIUM OXIDE PO*, Take 1 tablet by mouth daily.   MISC NATURAL PRODUCTS EX, Apply topically. Bi-Est 80/20 cream - 5mg /ml - [id number] (2 clicks) twice a day   MISC NATURAL PRODUCTS PO, Take 1 tablet by mouth at bedtime. Naltrexone 4.5mg    MISC NATURAL PRODUCTS PO, Take 1 tablet by mouth daily. T3 T4 compound for thyroid   MISC NATURAL PRODUCTS PO, Take 1 tablet by mouth daily. MCT/DHA Collagen Keto Balance   MISC NATURAL PRODUCTS PO, Take 1 tablet by mouth daily. Amla Extract   MISC NATURAL PRODUCTS PO, Take 1 tablet by mouth daily. AMPK Metabolic Activator - Hesperidin   MISC NATURAL PRODUCTS PO, Take 1 tablet by mouth daily. Cruciferous Veggie Extract - Broccoli, 13C, DIM, etc..   MISC NATURAL PRODUCTS PO, Take 1 tablet by mouth  daily. Omega 7   MISC NATURAL PRODUCTS PO, Take 1 tablet by mouth daily. Mushroom Complex   Multiple Vitamin (MULTIVITAMIN) tablet, Take 1 tablet by mouth daily. Whole food mineral complex.   Naltrexone HCl (REVIA PO), daily at 6 (six) AM.   NATTOKINASE PO, Take 1 tablet by mouth daily.   Nutritional Supplements (ADRENAL COMPLEX PO), Take 1 tablet by mouth daily.   Nutritional Supplements (PYCNOGENOL PO), Take 1 tablet by mouth daily. & gotu Kola   Omega-3 Fatty Acids (EQL OMEGA 3 FISH OIL PO), Take by mouth daily.     Probiotic Product (PROBIOTIC DAILY PO), Take 1 tablet by mouth daily.   QUERCETIN PO, Take 1 tablet by mouth daily.   TURMERIC CURCUMIN PO, Take 1 tablet by mouth daily. * These medications belong to multiple therapeutic classes and are listed under each applicable group.  Medical History:  Past Medical History:  Diagnosis Date   Atrial fibrillation ([place]) 2011   Breast cancer [facility]) 2003   Right lumpectomy and sentinel lymph node  biopsy December 2003 for a pT2 pN0, stage IIA  invasive ductal carcinoma, grade 3   Cataracts, bilateral    Essential hypertension    Hypothyroidism    Venous insufficiency    Allergies:  Allergies  Allergen Reactions   Penicillins     As a child.   Sulfonamide Derivatives      Surgical History:  She  has a past surgical history that includes BREAST TUMOR (Right, 06/11/2002); Tonsillectomy (06/12/1951); Reconstruction of nose (06/12/1983); Tubal ligation; Excision mass lower extremeties (Left, 12/31/2016); and Liposuction (Left, 12/31/2016). Family History:  Her family history includes Atrial fibrillation in her mother; Diabetes in her father; Heart attack in her father and paternal grandfather; Hypertension in her brother; Other in her paternal grandmother; Stroke in her mother; Thyroid disease in her mother.  REVIEW OF SYSTEMS  : All other systems reviewed and negative except where noted in the History of Present Illness.  PHYSICAL EXAM: BP (!) 150/86 (BP Location: Left Arm, Patient Position: Sitting, Cuff Size: Normal)   Pulse 72   Ht 5' 7.5" (1.715 m) Comment: height measured without shoes  Wt 157 lb 2 oz (71.3 kg)   BMI 24.25 kg/m  General Appearance: Well nourished, appears younger than stated age in no apparent distress. Head:   Normocephalic and atraumatic. Eyes:  sclerae anicteric,conjunctive pink  Respiratory: Respiratory effort normal, BS equal bilaterally without rales, rhonchi, wheezing. Cardio: RRR with no MRGs. Peripheral pulses intact.  Abdomen: Soft,  Non-distended ,active bowel sounds. mild tenderness in the epigastrium. Without guarding and Without rebound. No masses. Rectal: Not evaluated Musculoskeletal: Full ROM, Normal gait. Without edema. Skin:  Dry and intact without significant lesions or rashes Neuro: Alert and  oriented x4;  No focal deficits. Psych:  Cooperative. Normal mood and affect.    Vladimir Crofts, PA-C 12:40 PM

## 2022-08-29 DIAGNOSIS — L989 Disorder of the skin and subcutaneous tissue, unspecified: Secondary | ICD-10-CM | POA: Diagnosis not present

## 2022-08-29 DIAGNOSIS — I10 Essential (primary) hypertension: Secondary | ICD-10-CM | POA: Diagnosis not present

## 2022-08-29 DIAGNOSIS — Z299 Encounter for prophylactic measures, unspecified: Secondary | ICD-10-CM | POA: Diagnosis not present

## 2022-08-31 DIAGNOSIS — D2261 Melanocytic nevi of right upper limb, including shoulder: Secondary | ICD-10-CM | POA: Diagnosis not present

## 2022-08-31 DIAGNOSIS — R202 Paresthesia of skin: Secondary | ICD-10-CM | POA: Diagnosis not present

## 2022-08-31 DIAGNOSIS — L814 Other melanin hyperpigmentation: Secondary | ICD-10-CM | POA: Diagnosis not present

## 2022-08-31 DIAGNOSIS — D2272 Melanocytic nevi of left lower limb, including hip: Secondary | ICD-10-CM | POA: Diagnosis not present

## 2022-08-31 DIAGNOSIS — D2262 Melanocytic nevi of left upper limb, including shoulder: Secondary | ICD-10-CM | POA: Diagnosis not present

## 2022-08-31 DIAGNOSIS — L821 Other seborrheic keratosis: Secondary | ICD-10-CM | POA: Diagnosis not present

## 2022-08-31 DIAGNOSIS — D225 Melanocytic nevi of trunk: Secondary | ICD-10-CM | POA: Diagnosis not present

## 2022-08-31 DIAGNOSIS — D2271 Melanocytic nevi of right lower limb, including hip: Secondary | ICD-10-CM | POA: Diagnosis not present

## 2022-09-03 DIAGNOSIS — I480 Paroxysmal atrial fibrillation: Secondary | ICD-10-CM | POA: Diagnosis not present

## 2022-09-05 ENCOUNTER — Ambulatory Visit: Payer: Medicare PPO | Admitting: Physician Assistant

## 2022-09-05 ENCOUNTER — Encounter: Payer: Self-pay | Admitting: Physician Assistant

## 2022-09-05 ENCOUNTER — Telehealth: Payer: Self-pay

## 2022-09-05 VITALS — BP 150/86 | HR 72 | Ht 67.5 in | Wt 157.1 lb

## 2022-09-05 DIAGNOSIS — K449 Diaphragmatic hernia without obstruction or gangrene: Secondary | ICD-10-CM | POA: Diagnosis not present

## 2022-09-05 DIAGNOSIS — I482 Chronic atrial fibrillation, unspecified: Secondary | ICD-10-CM | POA: Diagnosis not present

## 2022-09-05 DIAGNOSIS — R1319 Other dysphagia: Secondary | ICD-10-CM

## 2022-09-05 DIAGNOSIS — Z1211 Encounter for screening for malignant neoplasm of colon: Secondary | ICD-10-CM | POA: Diagnosis not present

## 2022-09-05 NOTE — Patient Instructions (Addendum)
You have been scheduled for an endoscopy. Please follow written instructions given to you at your visit today. If you use inhalers (even only as needed), please bring them with you on the day of your procedure.   You will be contaced by our office prior to your procedure for directions on holding your Xarelto.  If you do not hear from our office 1 week prior to your scheduled procedure, please call [phone] to discuss.   Silent reflux: Not all heartburn burns...Marland KitchenMarland KitchenMarland Kitchen  What is LPR? Laryngopharyngeal reflux (LPR) or silent reflux is a condition in which acid that is made in the stomach travels up the esophagus (swallowing tube) and gets to the throat. Not everyone with reflux has a lot of heartburn or indigestion. In fact, many people with LPR never have heartburn. This is why LPR is called SILENT REFLUX, and the terms "Silent reflux" and "LPR" are often used interchangeably. Because LPR is silent, it is sometimes difficult to diagnose.  How can you tell if you have LPR?  Chronic hoarseness- Some people have hoarseness that comes and goes throat clearing  Cough It can cause shortness of breath and cause asthma like symptoms. a feeling of a lump in the throat  difficulty swallowing a problem with too much nose and throat drainage.  Some people will feel their esophagus spasm which feels like their heart beating hard and fast, this will usually be after a meal, at rest, or lying down at night.    How do I treat this? Treatment for LPR should be individualized, and your doctor will suggest the best treatment for you. Generally there are several treatments for LPR: changing habits and diet to reduce reflux,  medications to reduce stomach acid, and  surgery to prevent reflux. Most people with LPR need to modify how and when they eat, as well as take some medication, to get well. Sometimes, nonprescription liquid antacids, such as Maalox, Gelucil and Mylanta are recommended. When used, these  antacids should be taken four times each day - one tablespoon one hour after each meal and before bedtime. Dietary and lifestyle changes alone are not often enough to control LPR - medications that reduce stomach acid are also usually needed. These must be prescribed by our doctor.   TIPS FOR REDUCING REFLUX AND LPR Control your LIFE-STYLE and your DIET! If you use tobacco, QUIT.  Smoking makes you reflux. After every cigarette you have some LPR.  Don't wear clothing that is too tight, especially around the waist (trousers, corsets, belts).  Do not lie down just after eating...in fact, do not eat within three hours of bedtime.  You should be on a low-fat diet.  Limit your intake of red meat.  Limit your intake of butter.  Avoid fried foods.  Avoid chocolate  Avoid cheese.  Avoid eggs. Specifically avoid caffeine (especially coffee and tea), soda pop (especially cola) and mints.  Avoid alcoholic beverages, particularly in the evening.

## 2022-09-05 NOTE — Telephone Encounter (Signed)
Patient is scheduled for EGD on 10/03/22 with Dr. Silverio Decamp. Medical clearance letter is faxed at [phone] to Dr. Woody Seller requesting to hold Xarelto for 2 days prior to procedure.

## 2022-09-06 NOTE — Telephone Encounter (Signed)
Received fax for Dr. Woody Seller office stating ok to hold Xarelto 2 days prior to procedure. Patient made aware.

## 2022-09-12 DIAGNOSIS — I272 Pulmonary hypertension, unspecified: Secondary | ICD-10-CM | POA: Diagnosis not present

## 2022-09-12 DIAGNOSIS — I10 Essential (primary) hypertension: Secondary | ICD-10-CM | POA: Diagnosis not present

## 2022-09-12 DIAGNOSIS — I340 Nonrheumatic mitral (valve) insufficiency: Secondary | ICD-10-CM | POA: Diagnosis not present

## 2022-09-12 DIAGNOSIS — Z299 Encounter for prophylactic measures, unspecified: Secondary | ICD-10-CM | POA: Diagnosis not present

## 2022-09-12 DIAGNOSIS — I4891 Unspecified atrial fibrillation: Secondary | ICD-10-CM | POA: Diagnosis not present

## 2022-09-13 DIAGNOSIS — Z853 Personal history of malignant neoplasm of breast: Secondary | ICD-10-CM | POA: Diagnosis not present

## 2022-09-13 DIAGNOSIS — N959 Unspecified menopausal and perimenopausal disorder: Secondary | ICD-10-CM | POA: Diagnosis not present

## 2022-09-13 DIAGNOSIS — I4891 Unspecified atrial fibrillation: Secondary | ICD-10-CM | POA: Diagnosis not present

## 2022-09-20 ENCOUNTER — Ambulatory Visit: Payer: Medicare PPO | Admitting: Cardiology

## 2022-09-25 NOTE — Progress Notes (Signed)
Reviewed and agree with documentation and assessment and plan. K. Veena Latania Bascomb , MD   

## 2022-10-02 ENCOUNTER — Encounter: Payer: Self-pay | Admitting: Certified Registered Nurse Anesthetist

## 2022-10-03 ENCOUNTER — Encounter: Payer: Self-pay | Admitting: Gastroenterology

## 2022-10-03 ENCOUNTER — Ambulatory Visit: Payer: Medicare PPO | Admitting: Gastroenterology

## 2022-10-03 VITALS — BP 181/99 | HR 79 | Temp 98.2°F | Ht 67.0 in | Wt 157.0 lb

## 2022-10-03 MED ORDER — SODIUM CHLORIDE 0.9 % IV SOLN: 500.0000 mL | INTRAVENOUS | Status: DC

## 2022-10-03 NOTE — Progress Notes (Signed)
Patient states she had an echocardiogram approx 1 month ago and requested the results be sent to [facility].  Patient states echo revealed abnormalities and she is being referred to cardiology.  Nothing noted in epic.  [facility] notified.  [employer] called, unable to reach anyone.  Patient called and spoke with [facility], states they are faxing report to [facility].  Procedure on hold. SChaplin, RN,BSN

## 2022-10-03 NOTE — Progress Notes (Signed)
Blood Pressure (Abnormal) 181/99   Pulse 79   Temperature 98.2 F (36.8 C)   Height  (1.702 m)   Weight 157 lb (71.2 kg)   Oxygen Saturation 100%   Body Mass Index 24.59 kg/m   Reviewed echocardiogram report, has severe MR and severe pulmonary hypertension.  RVSP 66 mmHg Will request cardiology clearance prior to proceeding with EGD.  She has a appointment with cardiology next week Will have patient follow-up in GI office next available appointment to discuss further management of dysphagia

## 2022-10-09 ENCOUNTER — Ambulatory Visit: Payer: Medicare PPO | Admitting: Cardiology

## 2022-10-09 ENCOUNTER — Encounter: Payer: Self-pay | Admitting: Cardiology

## 2022-10-09 ENCOUNTER — Ambulatory Visit
Admission: RE | Admit: 2022-10-09 | Discharge: 2022-10-09 | Disposition: A | Discharge status: Home / Self Care | Payer: Medicare PPO | Source: Ambulatory Visit | Attending: Cardiology | Admitting: Cardiology

## 2022-10-09 VITALS — BP 184/93 | HR 78 | Resp 16 | Ht 67.0 in | Wt 155.2 lb

## 2022-10-09 DIAGNOSIS — I340 Nonrheumatic mitral (valve) insufficiency: Secondary | ICD-10-CM | POA: Diagnosis not present

## 2022-10-09 DIAGNOSIS — R0989 Other specified symptoms and signs involving the circulatory and respiratory systems: Secondary | ICD-10-CM | POA: Diagnosis not present

## 2022-10-09 DIAGNOSIS — I1A0 Resistant hypertension: Secondary | ICD-10-CM

## 2022-10-09 DIAGNOSIS — I4821 Permanent atrial fibrillation: Secondary | ICD-10-CM

## 2022-10-09 DIAGNOSIS — R0609 Other forms of dyspnea: Secondary | ICD-10-CM

## 2022-10-09 MED ORDER — HYDRALAZINE HCL 25 MG PO TABS
25.0000 mg | ORAL_TABLET | Freq: Three times a day (TID) | ORAL | 1 refills | Status: AC
Start: 2022-10-09 — End: ?

## 2022-10-09 MED ORDER — SPIRONOLACTONE 25 MG PO TABS
25.0000 mg | ORAL_TABLET | Freq: Every day | ORAL | 3 refills | Status: DC
Start: 2022-10-09 — End: 2022-11-21

## 2022-10-09 MED ORDER — ROSUVASTATIN CALCIUM 10 MG PO TABS
10.0000 mg | ORAL_TABLET | Freq: Every day | ORAL | 2 refills | Status: DC
Start: 2022-10-09 — End: 2023-07-17

## 2022-10-09 NOTE — H&P (View-Only) (Signed)
 Primary Physician/Referring:  Vyas, Dhruv B, MD  Patient ID: [name], female    DOB: 09/21/1945, 77 y.o.   MRN: [id number]  Chief Complaint  Patient presents with  . Persistent atrial fibrillation  . New Patient (Initial Visit)    Transfer care from [facility]   HPI:    [name]  is a 77 y.o. Female patient with persistent atrial fibrillation, primary hypertension, who prefers holistic approach to medical management self-referred to establish cardiac care, previously seen by Dr. Sam McDowell.  Patient states that for the past 5 to 6 months she has noticed gradually worsening dyspnea.  Her husband is extremely concerned about her presentation, states that although patient does not complain about her dyspnea or decreased exercise capacity, he has noticed marked change in her overall status in the past 5 to 6 months.  She has also noticed mild leg edema especially at the ankles.  Denies chest pain, palpitations, PND or orthopnea.  Past Medical History:  Diagnosis Date  . Atrial fibrillation (HCC) 2011  . Breast cancer (HCC) 2003   Right lumpectomy and sentinel lymph node biopsy December 2003 for a pT2 pN0, stage IIA  invasive ductal carcinoma, grade 3  . Cataracts, bilateral   . Essential hypertension   . Hypothyroidism   . Venous insufficiency    Past Surgical History:  Procedure Laterality Date  . BREAST TUMOR Right 06/11/2002  . EXCISION MASS LOWER EXTREMETIES Left 12/31/2016   Procedure: EXCISION MASS OF LEFT THIGH;  Surgeon: Truesdale, Gerald, MD;  Location: [facility];  Service: Plastics;  Laterality: Left;  . LIPOSUCTION Left 12/31/2016   Procedure: LIPOSUCTION ASSISTANCE;  Surgeon: Truesdale, Gerald, MD;  Location: [facility];  Service: Plastics;  Laterality: Left;  . RECONSTRUCTION OF NOSE  06/12/1983  . TONSILLECTOMY  06/12/1951  . TUBAL LIGATION     Family History  Problem Relation Age of Onset  . Stroke Mother   . Atrial  fibrillation Mother   . Thyroid disease Mother   . Heart attack Father   . Diabetes Father   . Hypertension Brother   . Other Paternal Grandmother        rare blood disease  . Heart attack Paternal Grandfather   . Esophageal cancer Neg Hx     Social History   Tobacco Use  . Smoking status: Never  . Smokeless tobacco: Never  Substance Use Topics  . Alcohol use: Yes    Alcohol/week: 1.0 standard drink of alcohol    Types: 1 Glasses of wine per week    Comment: 1-2 glasses weekly   Marital Status: Single  ROS  Review of Systems  Cardiovascular:  Positive for dyspnea on exertion and leg swelling. Negative for chest pain.   Objective      10/09/2022   10:39 AM 10/09/2022   10:24 AM 10/03/2022    2:41 PM  Vitals with BMI  Height  5' 7" 5' 7"  Weight  155 lbs 3 oz 157 lbs  BMI  24.3 24.58  Systolic 184 191 181  Diastolic 93 111 99  Pulse 78 78 79   SpO2: 98 %  Physical Exam Neck:     Vascular: Carotid bruit (left) present. No JVD.  Cardiovascular:     Rate and Rhythm: Normal rate and regular rhythm.     Pulses: Normal pulses and intact distal pulses.     Heart sounds: S1 normal and S2 normal. Murmur heard.       Midsystolic murmur is present with a grade of 2/6 at the apex.     No gallop.     Comments: Prominent abdominal bruit Pulmonary:     Effort: Pulmonary effort is normal.     Breath sounds: Normal breath sounds.  Abdominal:     General: Bowel sounds are normal.     Palpations: Abdomen is soft.  Musculoskeletal:     Right lower leg: Edema (2+ ankle pitting) present.     Left lower leg: Edema (2+ ankle pitting) present.   Laboratory examination:   External labs:   06/28/2022  Hemoglobin 14.6, platelets 336, BUN 8, creatinine 0.65, potassium 4.6, AST 22, ALT 16,  EGFR 91 mL  T. cholesterol 185, triglycerides 66, HDL 49, LDL 124,   TSH 2.56, hemoglobin A1c 5.9%  Other Studies Reviewed Today:   Radiology:    Cardiac Studies:   Echocardiogram  01/15/2017 ([facility]): LVEF 55-60%, mildly dilated left atrium, normal right ventricular contraction. Mildly thickened aortic leaflets, trace mitral regurgitation, RVSP estimated 34 mmHg, no pericardial effusion.  Echocardiogram 11/03/2022: Normal LV size, normal LV wall thickness, EF 50 to 55%.  Normal diastolic function.  Gated LVEF 67%. Left atrium is normal in size. Right atrial cavity is moderately dilated. Mild aortic valve sclerosis.  No aortic regurgitation. Structurally normal-appearing mitral valve with moderate to severe mitral regurgitation.  Large central MR, dense irregular color-flow jet. Structurally normal-appearing tricuspid valve with severe tricuspid regurgitation.  Severe pulmonary hypertension, RV systolic pressure 66 mmHg. IVC is normal with normal respiratory response.  EKG:   EKG 10/09/2022: Atrial fibrillation with controlled ventricular response at the rate of 79 bpm, no evidence of ischemia.  Compared to prior EKG no significant change.  EKG 08/01/2022: Atrial fibrillation with controlled ventricular response at the rate of 69 bpm, no evidence of ischemia.  Medications and allergies   Allergies  Allergen Reactions  . Penicillins     As a child.  . Sulfonamide Derivatives      Medication list   Current Outpatient Medications:  .  Acetylcysteine (NAC PO), Take 1 tablet by mouth daily. Blood Orange Complex / Selenium, Disp: , Rfl:  .  ALOE VERA PO, Take 1 tablet by mouth daily., Disp: , Rfl:  .  Ascorbic Acid (VITAMIN C PO), Take 1 tablet by mouth daily. Lysine, Disp: , Rfl:  .  Calcium Carbonate-Vitamin D (CALCIUM-D PO), Take 1 tablet by mouth daily., Disp: , Rfl:  .  Capsicum, Cayenne, (CAYENNE PO), Take 1 tablet by mouth daily., Disp: , Rfl:  .  Coenzyme Q10 (COQ10 PO), Take 1 tablet by mouth daily., Disp: , Rfl:  .  GARLIC PO, Take 1 tablet by mouth daily. Fermented Black, Disp: , Rfl:  .  Green Tea, Camillia sinensis, (GREEN TEA PO),  Take by mouth daily., Disp: , Rfl:  .  levothyroxine (SYNTHROID) 137 MCG tablet, Take 137 mcg by mouth every morning., Disp: , Rfl:  .  liothyronine (CYTOMEL) 5 MCG tablet, Take 5 mcg by mouth every morning., Disp: , Rfl:  .  metoprolol succinate (TOPROL-XL) 50 MG 24 hr tablet, Take 50 mg by mouth daily., Disp: , Rfl:  .  MISC NATURAL PRODUCTS PO, Take 1 tablet by mouth at bedtime. Naltrexone 4.5mg, Disp: , Rfl:  .  MISC NATURAL PRODUCTS PO, Take 1 tablet by mouth daily. T3 T4 compound for thyroid, Disp: , Rfl:  .  MISC NATURAL PRODUCTS PO, Take 1 tablet by mouth daily. Amla Extract, Disp: ,   Rfl:  .  MISC NATURAL PRODUCTS PO, Take 1 tablet by mouth daily. AMPK Metabolic Activator - Hesperidin, Disp: , Rfl:  .  MISC NATURAL PRODUCTS PO, Take 1 tablet by mouth daily. Cruciferous Veggie Extract - Broccoli, 13C, DIM, etc.., Disp: , Rfl:  .  MISC NATURAL PRODUCTS PO, Take 1 tablet by mouth daily. Omega 7, Disp: , Rfl:  .  MISC NATURAL PRODUCTS PO, Take 1 tablet by mouth daily. Mushroom Complex, Disp: , Rfl:  .  Multiple Vitamin (MULTIVITAMIN) tablet, Take 1 tablet by mouth daily. Whole food mineral complex., Disp: , Rfl:  .  Naltrexone HCl (REVIA PO), daily at 6 (six) AM., Disp: , Rfl:  .  NATTOKINASE PO, Take 1 tablet by mouth daily., Disp: , Rfl:  .  Omega-3 Fatty Acids (EQL OMEGA 3 FISH OIL PO), Take by mouth daily.  , Disp: , Rfl:  .  Probiotic Product (PROBIOTIC DAILY PO), Take 1 tablet by mouth daily., Disp: , Rfl:  .  Progesterone Micronized (PROGESTERONE PO), Take 1 tablet by mouth daily., Disp: , Rfl:  .  rivaroxaban (XARELTO) 20 MG TABS tablet, Take 20 mg by mouth daily with supper., Disp: , Rfl:  .  rosuvastatin (CRESTOR) 10 MG tablet, Take 1 tablet (10 mg total) by mouth daily., Disp: 30 tablet, Rfl: 2 .  spironolactone (ALDACTONE) 25 MG tablet, Take 1 tablet (25 mg total) by mouth daily., Disp: 90 tablet, Rfl: 3 .  Digestive Enzymes (BETAINE HCL PO), Take 1 tablet by mouth daily.  Digestion formula., Disp: , Rfl:  .  hydrALAZINE (APRESOLINE) 25 MG tablet, Take 1 tablet (25 mg total) by mouth 3 (three) times daily., Disp: 270 tablet, Rfl: 1 .  MISC NATURAL PRODUCTS EX, Apply topically. Bi-Est 80/20 cream - 5mg/ml - 1/2ml (2 clicks) twice a day, Disp: , Rfl:  .  QUERCETIN PO, Take 1 tablet by mouth daily. (Patient not taking: Reported on 10/09/2022), Disp: , Rfl:  .  TURMERIC CURCUMIN PO, Take 1 tablet by mouth daily. (Patient not taking: Reported on 10/09/2022), Disp: , Rfl:   Assessment     ICD-10-CM   1. Persistent atrial fibrillation (HCC)  I48.19 EKG 12-Lead    CBC    Basic metabolic panel    2. Severe mitral regurgitation  I34.0 Pro b natriuretic peptide (BNP)    3. Resistant hypertension  I1A.0 spironolactone (ALDACTONE) 25 MG tablet    Aldosterone + renin activity w/ ratio    PCV RENAL/RENAL ARTERY DUPLEX COMPLETE    hydrALAZINE (APRESOLINE) 25 MG tablet    4. Left carotid bruit  R09.89 PCV CAROTID DUPLEX (BILATERAL)    rosuvastatin (CRESTOR) 10 MG tablet    5. Abdominal bruit  R09.89 PCV RENAL/RENAL ARTERY DUPLEX COMPLETE    rosuvastatin (CRESTOR) 10 MG tablet    6. Dyspnea on exertion  R06.09 Pro b natriuretic peptide (BNP)       Orders Placed This Encounter  Procedures  . Aldosterone + renin activity w/ ratio  . Pro b natriuretic peptide (BNP)  . CBC  . Basic metabolic panel  . EKG 12-Lead    Meds ordered this encounter  Medications  . spironolactone (ALDACTONE) 25 MG tablet    Sig: Take 1 tablet (25 mg total) by mouth daily.    Dispense:  90 tablet    Refill:  3  . rosuvastatin (CRESTOR) 10 MG tablet    Sig: Take 1 tablet (10 mg total) by mouth daily.    Dispense:  30   tablet    Refill:  2  . hydrALAZINE (APRESOLINE) 25 MG tablet    Sig: Take 1 tablet (25 mg total) by mouth 3 (three) times daily.    Dispense:  270 tablet    Refill:  1    Medications Discontinued During This Encounter  Medication Reason  . Cholecalciferol (VITAMIN  D PO)   . ivermectin (STROMECTOL) 3 MG TABS tablet   . KRILL OIL PO   . MAGNESIUM OXIDE PO   . MISC NATURAL PRODUCTS PO   . Nutritional Supplements (ADRENAL COMPLEX PO)   . Nutritional Supplements (PYCNOGENOL PO)   . 0.9 %  sodium chloride infusion   . hydrALAZINE (APRESOLINE) 25 MG tablet Reorder     Recommendations:   [name] is a 77 y.o. Female patient with persistent atrial fibrillation, primary hypertension, who prefers holistic approach to medical management self-referred to establish cardiac care, previously seen by Dr. Sam McDowell.  1. Severe mitral regurgitation External echocardiogram reviewed, patient having moderate to severe mitral regurgitation and also severe tricuspid regurgitation and moderate to severe pulm hypertension.  I will try to obtain the images of the echocardiogram.  She needs TEE to better define her valvular abnormality.  If indeed moderate to severe MR is confirmed, she will need mitral valve repair and/or replacement as she is symptomatic with dyspnea on exertion although she leads a fairly active life.  Will check NT proBNP. - Pro b natriuretic peptide (BNP)  2.  Permanent atrial fibrillation (HCC) Patient has permanent atrial fibrillation, has been in atrial fibrillation for greater than 8 years.  Surprisingly echocardiogram reveals normal left atrial size, hence review of images of the echo would be important.  However TEE will certainly better define her cardiac structures better. - EKG 12-Lead - CBC - Basic metabolic panel  3. Resistant hypertension Patient has resting hypertension.  Markedly elevated blood pressure.  She has very prominent abdominal bruit.  Would like to exclude secondary causes of hypertension especially renal artery stenosis.  I have started her on spironolactone and clearly given instructions to obtain BMP in 2 weeks.  Along with that below orders were placed.  - spironolactone (ALDACTONE) 25 MG tablet; Take 1 tablet (25  mg total) by mouth daily.  Dispense: 90 tablet; Refill: 3 - Aldosterone + renin activity w/ ratio - PCV RENAL/RENAL ARTERY DUPLEX COMPLETE; Future - hydrALAZINE (APRESOLINE) 25 MG tablet; Take 1 tablet (25 mg total) by mouth 3 (three) times daily.  Dispense: 270 tablet; Refill: 1  4. Left carotid bruit Obtain carotid artery duplex.  In view of hypercholesterolemia, abdominal bruit, carotid duplex, will start her on Crestor 10 mg daily. - PCV CAROTID DUPLEX (BILATERAL); Future - rosuvastatin (CRESTOR) 10 MG tablet; Take 1 tablet (10 mg total) by mouth daily.  Dispense: 30 tablet; Refill: 2  5. Abdominal bruit As dictated above. - PCV RENAL/RENAL ARTERY DUPLEX COMPLETE; Future - rosuvastatin (CRESTOR) 10 MG tablet; Take 1 tablet (10 mg total) by mouth daily.  Dispense: 30 tablet; Refill: 2  6. Dyspnea on exertion Chest x-ray will be obtained.  NT proBNP has been ordered.  This was a >60-minute office visit encounter. - Pro b natriuretic peptide (BNP) - DG Chest 2 View; Future   Anyelina Claycomb, MD, FACC 10/09/2022, 12:02 PM Office: [phone] 

## 2022-10-09 NOTE — H&P (View-Only) (Signed)
 Primary Physician/Referring:  Vyas, Dhruv B, MD  Patient ID: [name], female    DOB: 02/17/1946, 77 y.o.   MRN: [id number]  Chief Complaint  Patient presents with  . Persistent atrial fibrillation  . New Patient (Initial Visit)    Transfer care from [facility]   HPI:    [name]  is a 77 y.o. Female patient with persistent atrial fibrillation, primary hypertension, who prefers holistic approach to medical management self-referred to establish cardiac care, previously seen by Dr. Sam McDowell.  Patient states that for the past 5 to 6 months she has noticed gradually worsening dyspnea.  Her husband is extremely concerned about her presentation, states that although patient does not complain about her dyspnea or decreased exercise capacity, he has noticed marked change in her overall status in the past 5 to 6 months.  She has also noticed mild leg edema especially at the ankles.  Denies chest pain, palpitations, PND or orthopnea.  Past Medical History:  Diagnosis Date  . Atrial fibrillation (HCC) 2011  . Breast cancer (HCC) 2003   Right lumpectomy and sentinel lymph node biopsy December 2003 for a pT2 pN0, stage IIA  invasive ductal carcinoma, grade 3  . Cataracts, bilateral   . Essential hypertension   . Hypothyroidism   . Venous insufficiency    Past Surgical History:  Procedure Laterality Date  . BREAST TUMOR Right 06/11/2002  . EXCISION MASS LOWER EXTREMETIES Left 12/31/2016   Procedure: EXCISION MASS OF LEFT THIGH;  Surgeon: Truesdale, Gerald, MD;  Location: [facility];  Service: Plastics;  Laterality: Left;  . LIPOSUCTION Left 12/31/2016   Procedure: LIPOSUCTION ASSISTANCE;  Surgeon: Truesdale, Gerald, MD;  Location: [facility];  Service: Plastics;  Laterality: Left;  . RECONSTRUCTION OF NOSE  06/12/1983  . TONSILLECTOMY  06/12/1951  . TUBAL LIGATION     Family History  Problem Relation Age of Onset  . Stroke Mother   . Atrial  fibrillation Mother   . Thyroid disease Mother   . Heart attack Father   . Diabetes Father   . Hypertension Brother   . Other Paternal Grandmother        rare blood disease  . Heart attack Paternal Grandfather   . Esophageal cancer Neg Hx     Social History   Tobacco Use  . Smoking status: Never  . Smokeless tobacco: Never  Substance Use Topics  . Alcohol use: Yes    Alcohol/week: 1.0 standard drink of alcohol    Types: 1 Glasses of wine per week    Comment: 1-2 glasses weekly   Marital Status: Single  ROS  Review of Systems  Cardiovascular:  Positive for dyspnea on exertion and leg swelling. Negative for chest pain.   Objective      10/09/2022   10:39 AM 10/09/2022   10:24 AM 10/03/2022    2:41 PM  Vitals with BMI  Height  5' 7" 5' 7"  Weight  155 lbs 3 oz 157 lbs  BMI  24.3 24.58  Systolic 184 191 181  Diastolic 93 111 99  Pulse 78 78 79   SpO2: 98 %  Physical Exam Neck:     Vascular: Carotid bruit (left) present. No JVD.  Cardiovascular:     Rate and Rhythm: Normal rate and regular rhythm.     Pulses: Normal pulses and intact distal pulses.     Heart sounds: S1 normal and S2 normal. Murmur heard.       Midsystolic murmur is present with a grade of 2/6 at the apex.     No gallop.     Comments: Prominent abdominal bruit Pulmonary:     Effort: Pulmonary effort is normal.     Breath sounds: Normal breath sounds.  Abdominal:     General: Bowel sounds are normal.     Palpations: Abdomen is soft.  Musculoskeletal:     Right lower leg: Edema (2+ ankle pitting) present.     Left lower leg: Edema (2+ ankle pitting) present.   Laboratory examination:   External labs:   06/28/2022  Hemoglobin 14.6, platelets 336, BUN 8, creatinine 0.65, potassium 4.6, AST 22, ALT 16,  EGFR 91 mL  T. cholesterol 185, triglycerides 66, HDL 49, LDL 124,   TSH 2.56, hemoglobin A1c 5.9%  Other Studies Reviewed Today:   Radiology:    Cardiac Studies:   Echocardiogram  01/15/2017 ([facility]): LVEF 55-60%, mildly dilated left atrium, normal right ventricular contraction. Mildly thickened aortic leaflets, trace mitral regurgitation, RVSP estimated 34 mmHg, no pericardial effusion.  Echocardiogram 11/03/2022: Normal LV size, normal LV wall thickness, EF 50 to 55%.  Normal diastolic function.  Gated LVEF 67%. Left atrium is normal in size. Right atrial cavity is moderately dilated. Mild aortic valve sclerosis.  No aortic regurgitation. Structurally normal-appearing mitral valve with moderate to severe mitral regurgitation.  Large central MR, dense irregular color-flow jet. Structurally normal-appearing tricuspid valve with severe tricuspid regurgitation.  Severe pulmonary hypertension, RV systolic pressure 66 mmHg. IVC is normal with normal respiratory response.  EKG:   EKG 10/09/2022: Atrial fibrillation with controlled ventricular response at the rate of 79 bpm, no evidence of ischemia.  Compared to prior EKG no significant change.  EKG 08/01/2022: Atrial fibrillation with controlled ventricular response at the rate of 69 bpm, no evidence of ischemia.  Medications and allergies   Allergies  Allergen Reactions  . Penicillins     As a child.  . Sulfonamide Derivatives      Medication list   Current Outpatient Medications:  .  Acetylcysteine (NAC PO), Take 1 tablet by mouth daily. Blood Orange Complex / Selenium, Disp: , Rfl:  .  ALOE VERA PO, Take 1 tablet by mouth daily., Disp: , Rfl:  .  Ascorbic Acid (VITAMIN C PO), Take 1 tablet by mouth daily. Lysine, Disp: , Rfl:  .  Calcium Carbonate-Vitamin D (CALCIUM-D PO), Take 1 tablet by mouth daily., Disp: , Rfl:  .  Capsicum, Cayenne, (CAYENNE PO), Take 1 tablet by mouth daily., Disp: , Rfl:  .  Coenzyme Q10 (COQ10 PO), Take 1 tablet by mouth daily., Disp: , Rfl:  .  GARLIC PO, Take 1 tablet by mouth daily. Fermented Black, Disp: , Rfl:  .  Green Tea, Camillia sinensis, (GREEN TEA PO),  Take by mouth daily., Disp: , Rfl:  .  levothyroxine (SYNTHROID) 137 MCG tablet, Take 137 mcg by mouth every morning., Disp: , Rfl:  .  liothyronine (CYTOMEL) 5 MCG tablet, Take 5 mcg by mouth every morning., Disp: , Rfl:  .  metoprolol succinate (TOPROL-XL) 50 MG 24 hr tablet, Take 50 mg by mouth daily., Disp: , Rfl:  .  MISC NATURAL PRODUCTS PO, Take 1 tablet by mouth at bedtime. Naltrexone 4.5mg, Disp: , Rfl:  .  MISC NATURAL PRODUCTS PO, Take 1 tablet by mouth daily. T3 T4 compound for thyroid, Disp: , Rfl:  .  MISC NATURAL PRODUCTS PO, Take 1 tablet by mouth daily. Amla Extract, Disp: ,   Rfl:  .  MISC NATURAL PRODUCTS PO, Take 1 tablet by mouth daily. AMPK Metabolic Activator - Hesperidin, Disp: , Rfl:  .  MISC NATURAL PRODUCTS PO, Take 1 tablet by mouth daily. Cruciferous Veggie Extract - Broccoli, 13C, DIM, etc.., Disp: , Rfl:  .  MISC NATURAL PRODUCTS PO, Take 1 tablet by mouth daily. Omega 7, Disp: , Rfl:  .  MISC NATURAL PRODUCTS PO, Take 1 tablet by mouth daily. Mushroom Complex, Disp: , Rfl:  .  Multiple Vitamin (MULTIVITAMIN) tablet, Take 1 tablet by mouth daily. Whole food mineral complex., Disp: , Rfl:  .  Naltrexone HCl (REVIA PO), daily at 6 (six) AM., Disp: , Rfl:  .  NATTOKINASE PO, Take 1 tablet by mouth daily., Disp: , Rfl:  .  Omega-3 Fatty Acids (EQL OMEGA 3 FISH OIL PO), Take by mouth daily.  , Disp: , Rfl:  .  Probiotic Product (PROBIOTIC DAILY PO), Take 1 tablet by mouth daily., Disp: , Rfl:  .  Progesterone Micronized (PROGESTERONE PO), Take 1 tablet by mouth daily., Disp: , Rfl:  .  rivaroxaban (XARELTO) 20 MG TABS tablet, Take 20 mg by mouth daily with supper., Disp: , Rfl:  .  rosuvastatin (CRESTOR) 10 MG tablet, Take 1 tablet (10 mg total) by mouth daily., Disp: 30 tablet, Rfl: 2 .  spironolactone (ALDACTONE) 25 MG tablet, Take 1 tablet (25 mg total) by mouth daily., Disp: 90 tablet, Rfl: 3 .  Digestive Enzymes (BETAINE HCL PO), Take 1 tablet by mouth daily.  Digestion formula., Disp: , Rfl:  .  hydrALAZINE (APRESOLINE) 25 MG tablet, Take 1 tablet (25 mg total) by mouth 3 (three) times daily., Disp: 270 tablet, Rfl: 1 .  MISC NATURAL PRODUCTS EX, Apply topically. Bi-Est 80/20 cream - 5mg/ml - 1/2ml (2 clicks) twice a day, Disp: , Rfl:  .  QUERCETIN PO, Take 1 tablet by mouth daily. (Patient not taking: Reported on 10/09/2022), Disp: , Rfl:  .  TURMERIC CURCUMIN PO, Take 1 tablet by mouth daily. (Patient not taking: Reported on 10/09/2022), Disp: , Rfl:   Assessment     ICD-10-CM   1. Persistent atrial fibrillation (HCC)  I48.19 EKG 12-Lead    CBC    Basic metabolic panel    2. Severe mitral regurgitation  I34.0 Pro b natriuretic peptide (BNP)    3. Resistant hypertension  I1A.0 spironolactone (ALDACTONE) 25 MG tablet    Aldosterone + renin activity w/ ratio    PCV RENAL/RENAL ARTERY DUPLEX COMPLETE    hydrALAZINE (APRESOLINE) 25 MG tablet    4. Left carotid bruit  R09.89 PCV CAROTID DUPLEX (BILATERAL)    rosuvastatin (CRESTOR) 10 MG tablet    5. Abdominal bruit  R09.89 PCV RENAL/RENAL ARTERY DUPLEX COMPLETE    rosuvastatin (CRESTOR) 10 MG tablet    6. Dyspnea on exertion  R06.09 Pro b natriuretic peptide (BNP)       Orders Placed This Encounter  Procedures  . Aldosterone + renin activity w/ ratio  . Pro b natriuretic peptide (BNP)  . CBC  . Basic metabolic panel  . EKG 12-Lead    Meds ordered this encounter  Medications  . spironolactone (ALDACTONE) 25 MG tablet    Sig: Take 1 tablet (25 mg total) by mouth daily.    Dispense:  90 tablet    Refill:  3  . rosuvastatin (CRESTOR) 10 MG tablet    Sig: Take 1 tablet (10 mg total) by mouth daily.    Dispense:  30   tablet    Refill:  2  . hydrALAZINE (APRESOLINE) 25 MG tablet    Sig: Take 1 tablet (25 mg total) by mouth 3 (three) times daily.    Dispense:  270 tablet    Refill:  1    Medications Discontinued During This Encounter  Medication Reason  . Cholecalciferol (VITAMIN  D PO)   . ivermectin (STROMECTOL) 3 MG TABS tablet   . KRILL OIL PO   . MAGNESIUM OXIDE PO   . MISC NATURAL PRODUCTS PO   . Nutritional Supplements (ADRENAL COMPLEX PO)   . Nutritional Supplements (PYCNOGENOL PO)   . 0.9 %  sodium chloride infusion   . hydrALAZINE (APRESOLINE) 25 MG tablet Reorder     Recommendations:   Anjenette W Caiazzo is a 77 y.o. Female patient with persistent atrial fibrillation, primary hypertension, who prefers holistic approach to medical management self-referred to establish cardiac care, previously seen by Dr. Sam McDowell.  1. Severe mitral regurgitation External echocardiogram reviewed, patient having moderate to severe mitral regurgitation and also severe tricuspid regurgitation and moderate to severe pulm hypertension.  I will try to obtain the images of the echocardiogram.  She needs TEE to better define her valvular abnormality.  If indeed moderate to severe MR is confirmed, she will need mitral valve repair and/or replacement as she is symptomatic with dyspnea on exertion although she leads a fairly active life.  Will check NT proBNP. - Pro b natriuretic peptide (BNP)  2.  Permanent atrial fibrillation (HCC) Patient has permanent atrial fibrillation, has been in atrial fibrillation for greater than 8 years.  Surprisingly echocardiogram reveals normal left atrial size, hence review of images of the echo would be important.  However TEE will certainly better define her cardiac structures better. - EKG 12-Lead - CBC - Basic metabolic panel  3. Resistant hypertension Patient has resting hypertension.  Markedly elevated blood pressure.  She has very prominent abdominal bruit.  Would like to exclude secondary causes of hypertension especially renal artery stenosis.  I have started her on spironolactone and clearly given instructions to obtain BMP in 2 weeks.  Along with that below orders were placed.  - spironolactone (ALDACTONE) 25 MG tablet; Take 1 tablet (25  mg total) by mouth daily.  Dispense: 90 tablet; Refill: 3 - Aldosterone + renin activity w/ ratio - PCV RENAL/RENAL ARTERY DUPLEX COMPLETE; Future - hydrALAZINE (APRESOLINE) 25 MG tablet; Take 1 tablet (25 mg total) by mouth 3 (three) times daily.  Dispense: 270 tablet; Refill: 1  4. Left carotid bruit Obtain carotid artery duplex.  In view of hypercholesterolemia, abdominal bruit, carotid duplex, will start her on Crestor 10 mg daily. - PCV CAROTID DUPLEX (BILATERAL); Future - rosuvastatin (CRESTOR) 10 MG tablet; Take 1 tablet (10 mg total) by mouth daily.  Dispense: 30 tablet; Refill: 2  5. Abdominal bruit As dictated above. - PCV RENAL/RENAL ARTERY DUPLEX COMPLETE; Future - rosuvastatin (CRESTOR) 10 MG tablet; Take 1 tablet (10 mg total) by mouth daily.  Dispense: 30 tablet; Refill: 2  6. Dyspnea on exertion Chest x-ray will be obtained.  NT proBNP has been ordered.  This was a >60-minute office visit encounter. - Pro b natriuretic peptide (BNP) - DG Chest 2 View; Future   Tom Ragsdale, MD, FACC 10/09/2022, 12:02 PM Office: [phone] 

## 2022-10-09 NOTE — Progress Notes (Signed)
Primary Physician/Referring:  Ignatius Specking, MD  Patient ID: [name], female    DOB: 1945-12-30, 77 y.o.   MRN: [id number]  Chief Complaint  Patient presents with  . Persistent atrial fibrillation  . New Patient (Initial Visit)    Transfer care from [facility]   HPI:    [name]  is a 77 y.o. Female patient with persistent atrial fibrillation, primary hypertension, who prefers holistic approach to medical management self-referred to establish cardiac care, previously seen by Dr. Simona Huh.  Patient states that for the past 5 to 6 months she has noticed gradually worsening dyspnea.  Her husband is extremely concerned about her presentation, states that although patient does not complain about her dyspnea or decreased exercise capacity, he has noticed marked change in her overall status in the past 5 to 6 months.  She has also noticed mild leg edema especially at the ankles.  Denies chest pain, palpitations, PND or orthopnea.  Past Medical History:  Diagnosis Date  . Atrial fibrillation (HCC) 2011  . Breast cancer [facility]) 2003   Right lumpectomy and sentinel lymph node biopsy December 2003 for a pT2 pN0, stage IIA  invasive ductal carcinoma, grade 3  . Cataracts, bilateral   . Essential hypertension   . Hypothyroidism   . Venous insufficiency    Past Surgical History:  Procedure Laterality Date  . BREAST TUMOR Right 06/11/2002  . EXCISION MASS LOWER EXTREMETIES Left 12/31/2016   Procedure: EXCISION MASS OF LEFT THIGH;  Surgeon: Louisa Second, MD;  Location: [facility];  Service: Plastics;  Laterality: Left;  . LIPOSUCTION Left 12/31/2016   Procedure: LIPOSUCTION ASSISTANCE;  Surgeon: Louisa Second, MD;  Location: [facility];  Service: Plastics;  Laterality: Left;  . RECONSTRUCTION OF NOSE  06/12/1983  . TONSILLECTOMY  06/12/1951  . TUBAL LIGATION     Family History  Problem Relation Age of Onset  . Stroke Mother   . Atrial  fibrillation Mother   . Thyroid disease Mother   . Heart attack Father   . Diabetes Father   . Hypertension Brother   . Other Paternal Grandmother        rare blood disease  . Heart attack Paternal Grandfather   . Esophageal cancer Neg Hx     Social History   Tobacco Use  . Smoking status: Never  . Smokeless tobacco: Never  Substance Use Topics  . Alcohol use: Yes    Alcohol/week: 1.0 standard drink of alcohol    Types: 1 Glasses of wine per week    Comment: 1-2 glasses weekly   Marital Status: Single  ROS  Review of Systems  Cardiovascular:  Positive for dyspnea on exertion and leg swelling. Negative for chest pain.   Objective      10/09/2022   10:39 AM 10/09/2022   10:24 AM 10/03/2022    2:41 PM  Vitals with BMI  Height  5\' 7"  5\' 7"   Weight  155 lbs 3 oz 157 lbs  BMI  24.3 24.58  Systolic 184 191 409  Diastolic 93 111 99  Pulse 78 78 79   SpO2: 98 %  Physical Exam Neck:     Vascular: Carotid bruit (left) present. No JVD.  Cardiovascular:     Rate and Rhythm: Normal rate and regular rhythm.     Pulses: Normal pulses and intact distal pulses.     Heart sounds: S1 normal and S2 normal. Murmur heard.  Midsystolic murmur is present with a grade of 2/6 at the apex.     No gallop.     Comments: Prominent abdominal bruit Pulmonary:     Effort: Pulmonary effort is normal.     Breath sounds: Normal breath sounds.  Abdominal:     General: Bowel sounds are normal.     Palpations: Abdomen is soft.  Musculoskeletal:     Right lower leg: Edema (2+ ankle pitting) present.     Left lower leg: Edema (2+ ankle pitting) present.   Laboratory examination:   External labs:   06/28/2022  Hemoglobin 14.6, platelets 336, BUN 8, creatinine 0.65, potassium 4.6, AST 22, ALT 16,  EGFR 91 mL  T. cholesterol 185, triglycerides 66, HDL 49, LDL 124,   TSH 2.56, hemoglobin A1c 5.9%  Other Studies Reviewed Today:   Radiology:    Cardiac Studies:   Echocardiogram  01/15/2017 [facility]): LVEF 55-60%, mildly dilated left atrium, normal right ventricular contraction. Mildly thickened aortic leaflets, trace mitral regurgitation, RVSP estimated 34 mmHg, no pericardial effusion.  Echocardiogram 11/03/2022: Normal LV size, normal LV wall thickness, EF 50 to 55%.  Normal diastolic function.  Gated LVEF 67%. Left atrium is normal in size. Right atrial cavity is moderately dilated. Mild aortic valve sclerosis.  No aortic regurgitation. Structurally normal-appearing mitral valve with moderate to severe mitral regurgitation.  Large central MR, dense irregular color-flow jet. Structurally normal-appearing tricuspid valve with severe tricuspid regurgitation.  Severe pulmonary hypertension, RV systolic pressure 66 mmHg. IVC is normal with normal respiratory response.  EKG:   EKG 10/09/2022: Atrial fibrillation with controlled ventricular response at the rate of 79 bpm, no evidence of ischemia.  Compared to prior EKG no significant change.  EKG 08/01/2022: Atrial fibrillation with controlled ventricular response at the rate of 69 bpm, no evidence of ischemia.  Medications and allergies   Allergies  Allergen Reactions  . Penicillins     As a child.  . Sulfonamide Derivatives      Medication list   Current Outpatient Medications:  .  Acetylcysteine (NAC PO), Take 1 tablet by mouth daily. Blood Orange Complex / Selenium, Disp: , Rfl:  .  ALOE VERA PO, Take 1 tablet by mouth daily., Disp: , Rfl:  .  Ascorbic Acid (VITAMIN C PO), Take 1 tablet by mouth daily. Lysine, Disp: , Rfl:  .  Calcium Carbonate-Vitamin D (CALCIUM-D PO), Take 1 tablet by mouth daily., Disp: , Rfl:  .  Capsicum, Cayenne, (CAYENNE PO), Take 1 tablet by mouth daily., Disp: , Rfl:  .  Coenzyme Q10 (COQ10 PO), Take 1 tablet by mouth daily., Disp: , Rfl:  .  GARLIC PO, Take 1 tablet by mouth daily. Fermented Black, Disp: , Rfl:  .  Green Tea, Camillia sinensis, (GREEN TEA PO),  Take by mouth daily., Disp: , Rfl:  .  levothyroxine (SYNTHROID) 137 MCG tablet, Take 137 mcg by mouth every morning., Disp: , Rfl:  .  liothyronine (CYTOMEL) 5 MCG tablet, Take 5 mcg by mouth every morning., Disp: , Rfl:  .  metoprolol succinate (TOPROL-XL) 50 MG 24 hr tablet, Take 50 mg by mouth daily., Disp: , Rfl:  .  MISC NATURAL PRODUCTS PO, Take 1 tablet by mouth at bedtime. Naltrexone 4.5mg , Disp: , Rfl:  .  MISC NATURAL PRODUCTS PO, Take 1 tablet by mouth daily. T3 T4 compound for thyroid, Disp: , Rfl:  .  MISC NATURAL PRODUCTS PO, Take 1 tablet by mouth daily. Amla Extract, Disp: ,  Rfl:  .  MISC NATURAL PRODUCTS PO, Take 1 tablet by mouth daily. AMPK Metabolic Activator - Hesperidin, Disp: , Rfl:  .  MISC NATURAL PRODUCTS PO, Take 1 tablet by mouth daily. Cruciferous Veggie Extract - Broccoli, 13C, DIM, etc.., Disp: , Rfl:  .  MISC NATURAL PRODUCTS PO, Take 1 tablet by mouth daily. Omega 7, Disp: , Rfl:  .  MISC NATURAL PRODUCTS PO, Take 1 tablet by mouth daily. Mushroom Complex, Disp: , Rfl:  .  Multiple Vitamin (MULTIVITAMIN) tablet, Take 1 tablet by mouth daily. Whole food mineral complex., Disp: , Rfl:  .  Naltrexone HCl (REVIA PO), daily at 6 (six) AM., Disp: , Rfl:  .  NATTOKINASE PO, Take 1 tablet by mouth daily., Disp: , Rfl:  .  Omega-3 Fatty Acids (EQL OMEGA 3 FISH OIL PO), Take by mouth daily.  , Disp: , Rfl:  .  Probiotic Product (PROBIOTIC DAILY PO), Take 1 tablet by mouth daily., Disp: , Rfl:  .  Progesterone Micronized (PROGESTERONE PO), Take 1 tablet by mouth daily., Disp: , Rfl:  .  rivaroxaban (XARELTO) 20 MG TABS tablet, Take 20 mg by mouth daily with supper., Disp: , Rfl:  .  rosuvastatin (CRESTOR) 10 MG tablet, Take 1 tablet (10 mg total) by mouth daily., Disp: 30 tablet, Rfl: 2 .  spironolactone (ALDACTONE) 25 MG tablet, Take 1 tablet (25 mg total) by mouth daily., Disp: 90 tablet, Rfl: 3 .  Digestive Enzymes (BETAINE HCL PO), Take 1 tablet by mouth daily.  Digestion formula., Disp: , Rfl:  .  hydrALAZINE (APRESOLINE) 25 MG tablet, Take 1 tablet (25 mg total) by mouth 3 (three) times daily., Disp: 270 tablet, Rfl: 1 .  MISC NATURAL PRODUCTS EX, Apply topically. Bi-Est 80/20 cream - 5mg /ml - 1/50ml (2 clicks) twice a day, Disp: , Rfl:  .  QUERCETIN PO, Take 1 tablet by mouth daily. (Patient not taking: Reported on 10/09/2022), Disp: , Rfl:  .  TURMERIC CURCUMIN PO, Take 1 tablet by mouth daily. (Patient not taking: Reported on 10/09/2022), Disp: , Rfl:   Assessment     ICD-10-CM   1. Persistent atrial fibrillation (HCC)  I48.19 EKG 12-Lead    CBC    Basic metabolic panel    2. Severe mitral regurgitation  I34.0 Pro b natriuretic peptide (BNP)    3. Resistant hypertension  I1A.0 spironolactone (ALDACTONE) 25 MG tablet    Aldosterone + renin activity w/ ratio    PCV RENAL/RENAL ARTERY DUPLEX COMPLETE    hydrALAZINE (APRESOLINE) 25 MG tablet    4. Left carotid bruit  R09.89 PCV CAROTID DUPLEX (BILATERAL)    rosuvastatin (CRESTOR) 10 MG tablet    5. Abdominal bruit  R09.89 PCV RENAL/RENAL ARTERY DUPLEX COMPLETE    rosuvastatin (CRESTOR) 10 MG tablet    6. Dyspnea on exertion  R06.09 Pro b natriuretic peptide (BNP)       Orders Placed This Encounter  Procedures  . Aldosterone + renin activity w/ ratio  . Pro b natriuretic peptide (BNP)  . CBC  . Basic metabolic panel  . EKG 12-Lead    Meds ordered this encounter  Medications  . spironolactone (ALDACTONE) 25 MG tablet    Sig: Take 1 tablet (25 mg total) by mouth daily.    Dispense:  90 tablet    Refill:  3  . rosuvastatin (CRESTOR) 10 MG tablet    Sig: Take 1 tablet (10 mg total) by mouth daily.    Dispense:  30  tablet    Refill:  2  . hydrALAZINE (APRESOLINE) 25 MG tablet    Sig: Take 1 tablet (25 mg total) by mouth 3 (three) times daily.    Dispense:  270 tablet    Refill:  1    Medications Discontinued During This Encounter  Medication Reason  . Cholecalciferol (VITAMIN  D PO)   . ivermectin (STROMECTOL) 3 MG TABS tablet   . KRILL OIL PO   . MAGNESIUM OXIDE PO   . MISC NATURAL PRODUCTS PO   . Nutritional Supplements (ADRENAL COMPLEX PO)   . Nutritional Supplements (PYCNOGENOL PO)   . 0.9 %  sodium chloride infusion   . hydrALAZINE (APRESOLINE) 25 MG tablet Reorder     Recommendations:   CHIZARAM LATINO is a 77 y.o. Female patient with persistent atrial fibrillation, primary hypertension, who prefers holistic approach to medical management self-referred to establish cardiac care, previously seen by Dr. Simona Huh.  1. Severe mitral regurgitation External echocardiogram reviewed, patient having moderate to severe mitral regurgitation and also severe tricuspid regurgitation and moderate to severe pulm hypertension.  I will try to obtain the images of the echocardiogram.  She needs TEE to better define her valvular abnormality.  If indeed moderate to severe MR is confirmed, she will need mitral valve repair and/or replacement as she is symptomatic with dyspnea on exertion although she leads a fairly active life.  Will check NT proBNP. - Pro b natriuretic peptide (BNP)  2.  Permanent atrial fibrillation [facility]) Patient has permanent atrial fibrillation, has been in atrial fibrillation for greater than 8 years.  Surprisingly echocardiogram reveals normal left atrial size, hence review of images of the echo would be important.  However TEE will certainly better define her cardiac structures better. - EKG 12-Lead - CBC - Basic metabolic panel  3. Resistant hypertension Patient has resting hypertension.  Markedly elevated blood pressure.  She has very prominent abdominal bruit.  Would like to exclude secondary causes of hypertension especially renal artery stenosis.  I have started her on spironolactone and clearly given instructions to obtain BMP in 2 weeks.  Along with that below orders were placed.  - spironolactone (ALDACTONE) 25 MG tablet; Take 1 tablet (25  mg total) by mouth daily.  Dispense: 90 tablet; Refill: 3 - Aldosterone + renin activity w/ ratio - PCV RENAL/RENAL ARTERY DUPLEX COMPLETE; Future - hydrALAZINE (APRESOLINE) 25 MG tablet; Take 1 tablet (25 mg total) by mouth 3 (three) times daily.  Dispense: 270 tablet; Refill: 1  4. Left carotid bruit Obtain carotid artery duplex.  In view of hypercholesterolemia, abdominal bruit, carotid duplex, will start her on Crestor 10 mg daily. - PCV CAROTID DUPLEX (BILATERAL); Future - rosuvastatin (CRESTOR) 10 MG tablet; Take 1 tablet (10 mg total) by mouth daily.  Dispense: 30 tablet; Refill: 2  5. Abdominal bruit As dictated above. - PCV RENAL/RENAL ARTERY DUPLEX COMPLETE; Future - rosuvastatin (CRESTOR) 10 MG tablet; Take 1 tablet (10 mg total) by mouth daily.  Dispense: 30 tablet; Refill: 2  6. Dyspnea on exertion Chest x-ray will be obtained.  NT proBNP has been ordered.  This was a >60-minute office visit encounter. - Pro b natriuretic peptide (BNP) - DG Chest 2 View; Future   [name], MD, [facility] 10/09/2022, 12:02 PM Office: [phone]

## 2022-10-11 DIAGNOSIS — Z0000 Encounter for general adult medical examination without abnormal findings: Secondary | ICD-10-CM | POA: Diagnosis not present

## 2022-10-11 DIAGNOSIS — I340 Nonrheumatic mitral (valve) insufficiency: Secondary | ICD-10-CM | POA: Diagnosis not present

## 2022-10-11 DIAGNOSIS — Z299 Encounter for prophylactic measures, unspecified: Secondary | ICD-10-CM | POA: Diagnosis not present

## 2022-10-11 DIAGNOSIS — I10 Essential (primary) hypertension: Secondary | ICD-10-CM | POA: Diagnosis not present

## 2022-10-11 DIAGNOSIS — E039 Hypothyroidism, unspecified: Secondary | ICD-10-CM | POA: Diagnosis not present

## 2022-10-12 NOTE — Progress Notes (Signed)
CXR 2 view 10/12/22  The heart size and mediastinal contours are within normal limits. Both lungs are clear. The visualized skeletal structures are unremarkable.

## 2022-10-17 DIAGNOSIS — I340 Nonrheumatic mitral (valve) insufficiency: Secondary | ICD-10-CM | POA: Diagnosis not present

## 2022-10-17 DIAGNOSIS — I1A0 Resistant hypertension: Secondary | ICD-10-CM | POA: Diagnosis not present

## 2022-10-17 DIAGNOSIS — R0609 Other forms of dyspnea: Secondary | ICD-10-CM | POA: Diagnosis not present

## 2022-10-17 DIAGNOSIS — I4819 Other persistent atrial fibrillation: Secondary | ICD-10-CM | POA: Diagnosis not present

## 2022-10-18 NOTE — Pre-Procedure Instructions (Signed)
Spoke with patient regarding TEE tomorrow - arrive at 0930, NPO after midnight, confirmed patient has ride home and responsible person to stay with pt for 24 hours after procedure, confirmed no missed doses of xarelto, take pills in the AM with sips of water

## 2022-10-19 ENCOUNTER — Encounter (HOSPITAL_COMMUNITY)
Admission: RE | Disposition: A | Discharge status: Home / Self Care | Payer: Self-pay | Source: Home / Self Care | Attending: Cardiology

## 2022-10-19 ENCOUNTER — Ambulatory Visit (HOSPITAL_COMMUNITY): Payer: Medicare PPO | Admitting: Anesthesiology

## 2022-10-19 ENCOUNTER — Ambulatory Visit (HOSPITAL_COMMUNITY)
Admission: RE | Admit: 2022-10-19 | Discharge: 2022-10-19 | Disposition: A | Discharge status: Home / Self Care | Payer: Medicare PPO | Source: Ambulatory Visit | Attending: Cardiology | Admitting: Cardiology

## 2022-10-19 ENCOUNTER — Ambulatory Visit (HOSPITAL_BASED_OUTPATIENT_CLINIC_OR_DEPARTMENT_OTHER): Payer: Medicare PPO | Admitting: Anesthesiology

## 2022-10-19 ENCOUNTER — Encounter (HOSPITAL_COMMUNITY): Payer: Self-pay | Admitting: Cardiology

## 2022-10-19 ENCOUNTER — Telehealth: Payer: Self-pay | Admitting: Cardiology

## 2022-10-19 ENCOUNTER — Ambulatory Visit (HOSPITAL_COMMUNITY)
Admission: RE | Admit: 2022-10-19 | Discharge: 2022-10-19 | Disposition: A | Discharge status: Home / Self Care | Payer: Medicare PPO | Attending: Cardiology | Admitting: Cardiology

## 2022-10-19 ENCOUNTER — Other Ambulatory Visit: Payer: Self-pay

## 2022-10-19 DIAGNOSIS — I4891 Unspecified atrial fibrillation: Secondary | ICD-10-CM

## 2022-10-19 DIAGNOSIS — I081 Rheumatic disorders of both mitral and tricuspid valves: Secondary | ICD-10-CM | POA: Insufficient documentation

## 2022-10-19 DIAGNOSIS — I088 Other rheumatic multiple valve diseases: Secondary | ICD-10-CM

## 2022-10-19 DIAGNOSIS — R0609 Other forms of dyspnea: Secondary | ICD-10-CM | POA: Diagnosis not present

## 2022-10-19 DIAGNOSIS — I272 Pulmonary hypertension, unspecified: Secondary | ICD-10-CM

## 2022-10-19 DIAGNOSIS — E039 Hypothyroidism, unspecified: Secondary | ICD-10-CM | POA: Diagnosis not present

## 2022-10-19 DIAGNOSIS — I4819 Other persistent atrial fibrillation: Secondary | ICD-10-CM | POA: Diagnosis not present

## 2022-10-19 DIAGNOSIS — I340 Nonrheumatic mitral (valve) insufficiency: Secondary | ICD-10-CM

## 2022-10-19 DIAGNOSIS — R0989 Other specified symptoms and signs involving the circulatory and respiratory systems: Secondary | ICD-10-CM | POA: Insufficient documentation

## 2022-10-19 DIAGNOSIS — I1A0 Resistant hypertension: Secondary | ICD-10-CM | POA: Diagnosis not present

## 2022-10-19 DIAGNOSIS — I10 Essential (primary) hypertension: Secondary | ICD-10-CM

## 2022-10-19 DIAGNOSIS — I361 Nonrheumatic tricuspid (valve) insufficiency: Secondary | ICD-10-CM

## 2022-10-19 HISTORY — PX: TEE WITHOUT CARDIOVERSION: SHX5443

## 2022-10-19 LAB — POCT I-STAT, CHEM 8
BUN: 9 mg/dL (ref 8–23)
Calcium, Ion: 1.24 mmol/L (ref 1.15–1.40)
Chloride: 100 mmol/L (ref 98–111)
Creatinine, Ser: 0.6 mg/dL (ref 0.44–1.00)
Glucose, Bld: 107 mg/dL — ABNORMAL HIGH (ref 70–99)
HCT: 45 % (ref 36.0–46.0)
Hemoglobin: 15.3 g/dL — ABNORMAL HIGH (ref 12.0–15.0)
Potassium: 4.3 mmol/L (ref 3.5–5.1)
Sodium: 140 mmol/L (ref 135–145)
TCO2: 28 mmol/L (ref 22–32)

## 2022-10-19 SURGERY — ECHOCARDIOGRAM, TRANSESOPHAGEAL
Anesthesia: Monitor Anesthesia Care

## 2022-10-19 MED ORDER — BUTAMBEN-TETRACAINE-BENZOCAINE 2-2-14 % EX AERO
INHALATION_SPRAY | CUTANEOUS | Status: AC
  Filled 2022-10-19: qty 20

## 2022-10-19 MED ORDER — SODIUM CHLORIDE 0.9 % IV SOLN: INTRAVENOUS | Status: DC

## 2022-10-19 MED ORDER — PROPOFOL 500 MG/50ML IV EMUL
INTRAVENOUS | Status: DC | PRN
  Administered 2022-10-19: 50 ug/kg/min via INTRAVENOUS

## 2022-10-19 MED ORDER — PROPOFOL 10 MG/ML IV BOLUS
INTRAVENOUS | Status: DC | PRN
  Administered 2022-10-19: 20 mg via INTRAVENOUS

## 2022-10-19 NOTE — Discharge Instructions (Signed)

## 2022-10-19 NOTE — Anesthesia Procedure Notes (Signed)
Procedure Name: MAC Date/Time: 10/19/2022 10:40 AM  Performed by: Adria Dill, CRNAPre-anesthesia Checklist: Patient identified, Emergency Drugs available, Suction available and Patient being monitored Patient Re-evaluated:Patient Re-evaluated prior to induction Oxygen Delivery Method: Nasal cannula Preoxygenation: Pre-oxygenation with 100% oxygen Induction Type: IV induction Airway Equipment and Method: Bite block Placement Confirmation: positive ETCO2 and breath sounds checked- equal and bilateral Dental Injury: Teeth and Oropharynx as per pre-operative assessment

## 2022-10-19 NOTE — Anesthesia Postprocedure Evaluation (Signed)
Anesthesia Post Note  Patient: Renalda Kaminer Oxendine  Procedure(s) Performed: TRANSESOPHAGEAL ECHOCARDIOGRAM     Patient location during evaluation: Cath Lab Anesthesia Type: MAC Level of consciousness: patient cooperative, oriented and awake and alert Pain management: pain level controlled Vital Signs Assessment: post-procedure vital signs reviewed and stable Respiratory status: nonlabored ventilation, spontaneous breathing and respiratory function stable Cardiovascular status: stable and blood pressure returned to baseline Postop Assessment: no apparent nausea or vomiting Anesthetic complications: no   No notable events documented.  Last Vitals:  Vitals:   10/19/22 1110 10/19/22 1119  BP: (!) 150/86 (!) 153/82  Pulse: 72 75  Resp: 20 13  Temp:    SpO2: 100% 98%    Last Pain:  Vitals:   10/19/22 1119  TempSrc:   PainSc: 0-No pain                 Cleola Perryman,E. Jessi Jessop

## 2022-10-19 NOTE — Anesthesia Preprocedure Evaluation (Signed)
Anesthesia Evaluation  Patient identified by MRN, date of birth, ID band Patient awake    Reviewed: Allergy & Precautions, NPO status , Patient's Chart, lab work & pertinent test results, reviewed documented beta blocker date and time   History of Anesthesia Complications Negative for: history of anesthetic complications  Airway Mallampati: I  TM Distance: >3 FB Neck ROM: Full    Dental  (+) Dental Advisory Given, Caps   Pulmonary neg pulmonary ROS   breath sounds clear to auscultation       Cardiovascular hypertension, Pt. on medications and Pt. on home beta blockers (-) angina + dysrhythmias Atrial Fibrillation  Rhythm:Irregular Rate:Normal  08/2022 ECHO: EF 50-55%, normal LVF, mod-severe MR, severe TR   Neuro/Psych negative neurological ROS     GI/Hepatic Neg liver ROS,GERD  Controlled,,  Endo/Other  Hypothyroidism    Renal/GU negative Renal ROS     Musculoskeletal   Abdominal   Peds  Hematology   Anesthesia Other Findings Breast cancer  Reproductive/Obstetrics                             Anesthesia Physical Anesthesia Plan  ASA: 3  Anesthesia Plan: MAC   Post-op Pain Management: Minimal or no pain anticipated   Induction:   PONV Risk Score and Plan: 2 and Treatment may vary due to age or medical condition  Airway Management Planned: Natural Airway and Nasal Cannula  Additional Equipment: None  Intra-op Plan:   Post-operative Plan:   Informed Consent: I have reviewed the patients History and Physical, chart, labs and discussed the procedure including the risks, benefits and alternatives for the proposed anesthesia with the patient or authorized representative who has indicated his/her understanding and acceptance.     Dental advisory given  Plan Discussed with: CRNA and Surgeon  Anesthesia Plan Comments:        Anesthesia Quick Evaluation

## 2022-10-19 NOTE — Transfer of Care (Signed)
Immediate Anesthesia Transfer of Care Note  Patient: [name]  Procedure(s) Performed: TRANSESOPHAGEAL ECHOCARDIOGRAM  Patient Location: PACU  Anesthesia Type:MAC  Level of Consciousness: awake  Airway & Oxygen Therapy: Patient Spontanous Breathing  Post-op Assessment: Report given to RN and Post -op Vital signs reviewed and stable  Post vital signs: Reviewed and stable  Last Vitals:  Vitals Value Taken Time  BP    Temp    Pulse    Resp    SpO2      Last Pain:  Vitals:   10/19/22 1003  TempSrc:   PainSc: 0-No pain         Complications: No notable events documented.

## 2022-10-19 NOTE — CV Procedure (Signed)
TEE: Under deep sedation, TEE was performed without complications: LV: Normal. Normal EF. RV: Normal LA: Dilated. Left atrial appendage: Normal without thrombus. Normal function. Inter atrial septum is intact without defect.  RA: Dilated  MV: Normal mild to moderate MR. TV: Normal Moderate TR. Moderate pulmonary hypertension AV: Normal. No AI or AS. PV: Normal. Trace PI.  Thoracic and ascending aorta: Mild diffuse plaque and  atheromatous changes in the arch and descending thoracic aorta.    [name], MD, [facility] 10/19/2022, 11:34 AM Office: [phone] Fax: [phone] Pager: [phone]

## 2022-10-19 NOTE — Interval H&P Note (Signed)
History and Physical Interval Note:  10/19/2022 10:24 AM  [name]  has presented today for surgery, with the diagnosis of AFIB.  The various methods of treatment have been discussed with the patient and family. After consideration of risks, benefits and other options for treatment, the patient has consented to  Procedure(s): TRANSESOPHAGEAL ECHOCARDIOGRAM (N/A) as a surgical intervention.  The patient's history has been reviewed, patient examined, no change in status, stable for surgery.  I have reviewed the patient's chart and labs.  Questions were answered to the patient's satisfaction.     [name]

## 2022-10-20 LAB — ECHO TEE

## 2022-10-23 NOTE — Telephone Encounter (Signed)
Scheduled for TEE

## 2022-10-24 ENCOUNTER — Encounter: Payer: Self-pay | Admitting: Cardiology

## 2022-10-24 LAB — ALDOSTERONE + RENIN ACTIVITY W/ RATIO
Aldos/Renin Ratio: <3.3 (ref 0.0–30.0)
Aldosterone: <1 ng/dL (ref 0.0–30.0)
Renin Activity, Plasma: 0.306 ng/mL/hr (ref 0.167–5.380)

## 2022-10-24 LAB — CBC
Hematocrit: 43.7 % (ref 34.0–46.6)
Hemoglobin: 14.9 g/dL (ref 11.1–15.9)
MCH: 33.8 pg — ABNORMAL HIGH (ref 26.6–33.0)
MCHC: 34.1 g/dL (ref 31.5–35.7)
MCV: 99 fL — ABNORMAL HIGH (ref 79–97)
Platelets: 248 10*3/uL (ref 150–450)
RBC: 4.41 x10E6/uL (ref 3.77–5.28)
RDW: 11.9 % (ref 11.7–15.4)
WBC: 5.4 10*3/uL (ref 3.4–10.8)

## 2022-10-24 LAB — BASIC METABOLIC PANEL
BUN/Creatinine Ratio: 14 (ref 12–28)
BUN: 11 mg/dL (ref 8–27)
CO2: 27 mmol/L (ref 20–29)
Calcium: 9.8 mg/dL (ref 8.7–10.3)
Chloride: 101 mmol/L (ref 96–106)
Creatinine, Ser: 0.76 mg/dL (ref 0.57–1.00)
Glucose: 63 mg/dL — ABNORMAL LOW (ref 70–99)
Potassium: 5.1 mmol/L (ref 3.5–5.2)
Sodium: 141 mmol/L (ref 134–144)
eGFR: 81 mL/min/{1.73_m2} (ref >59)

## 2022-10-24 LAB — PRO B NATRIURETIC PEPTIDE: NT-Pro BNP: 977 pg/mL — ABNORMAL HIGH (ref 0–738)

## 2022-10-24 NOTE — Progress Notes (Signed)
  Ref Range & Units 10/17/2022 Aldosterone   0.0 - 30.0 ng/dL                       <1.6 Renin Activity, Plasma 0.167 - 5.380 ng/mL/hr 0.306 Aldos/Renin Ratio 0.0 - 30.0                                    <3.3

## 2022-10-25 NOTE — Telephone Encounter (Signed)
From patient.

## 2022-11-02 ENCOUNTER — Ambulatory Visit (HOSPITAL_COMMUNITY)
Admission: RE | Admit: 2022-11-02 | Discharge: 2022-11-02 | Disposition: A | Discharge status: Home / Self Care | Payer: Medicare PPO | Attending: Cardiology | Admitting: Cardiology

## 2022-11-02 ENCOUNTER — Encounter (HOSPITAL_COMMUNITY)
Admission: RE | Disposition: A | Discharge status: Home / Self Care | Payer: Self-pay | Source: Home / Self Care | Attending: Cardiology

## 2022-11-02 ENCOUNTER — Other Ambulatory Visit: Payer: Self-pay

## 2022-11-02 DIAGNOSIS — I4821 Permanent atrial fibrillation: Secondary | ICD-10-CM | POA: Diagnosis not present

## 2022-11-02 DIAGNOSIS — I340 Nonrheumatic mitral (valve) insufficiency: Secondary | ICD-10-CM | POA: Diagnosis not present

## 2022-11-02 DIAGNOSIS — I5031 Acute diastolic (congestive) heart failure: Secondary | ICD-10-CM | POA: Diagnosis not present

## 2022-11-02 DIAGNOSIS — R0609 Other forms of dyspnea: Secondary | ICD-10-CM | POA: Diagnosis not present

## 2022-11-02 DIAGNOSIS — I1A0 Resistant hypertension: Secondary | ICD-10-CM | POA: Insufficient documentation

## 2022-11-02 DIAGNOSIS — Z8249 Family history of ischemic heart disease and other diseases of the circulatory system: Secondary | ICD-10-CM | POA: Diagnosis not present

## 2022-11-02 DIAGNOSIS — R0989 Other specified symptoms and signs involving the circulatory and respiratory systems: Secondary | ICD-10-CM | POA: Insufficient documentation

## 2022-11-02 DIAGNOSIS — I272 Pulmonary hypertension, unspecified: Secondary | ICD-10-CM | POA: Diagnosis not present

## 2022-11-02 HISTORY — PX: RIGHT/LEFT HEART CATH AND CORONARY ANGIOGRAPHY: CATH118266

## 2022-11-02 LAB — POCT I-STAT EG7
Acid-Base Excess: 0 mmol/L (ref 0.0–2.0)
Acid-Base Excess: 1 mmol/L (ref 0.0–2.0)
Bicarbonate: 24.6 mmol/L (ref 20.0–28.0)
Bicarbonate: 25.4 mmol/L (ref 20.0–28.0)
Calcium, Ion: 1.16 mmol/L (ref 1.15–1.40)
Calcium, Ion: 1.15 mmol/L (ref 1.15–1.40)
HCT: 41 % (ref 36.0–46.0)
HCT: 41 % (ref 36.0–46.0)
Hemoglobin: 13.9 g/dL (ref 12.0–15.0)
Hemoglobin: 13.9 g/dL (ref 12.0–15.0)
O2 Saturation: 79 %
O2 Saturation: 81 %
Potassium: 3.8 mmol/L (ref 3.5–5.1)
Potassium: 3.8 mmol/L (ref 3.5–5.1)
Sodium: 140 mmol/L (ref 135–145)
Sodium: 140 mmol/L (ref 135–145)
TCO2: 26 mmol/L (ref 22–32)
TCO2: 27 mmol/L (ref 22–32)
pCO2, Ven: 39.2 mmHg — ABNORMAL LOW (ref 44–60)
pCO2, Ven: 39.6 mmHg — ABNORMAL LOW (ref 44–60)
pH, Ven: 7.406 (ref 7.25–7.43)
pH, Ven: 7.416 (ref 7.25–7.43)
pO2, Ven: 43 mmHg (ref 32–45)
pO2, Ven: 44 mmHg (ref 32–45)

## 2022-11-02 LAB — POCT I-STAT 7, (LYTES, BLD GAS, ICA,H+H)
Acid-Base Excess: 0 mmol/L (ref 0.0–2.0)
Bicarbonate: 23.6 mmol/L (ref 20.0–28.0)
Calcium, Ion: 1.16 mmol/L (ref 1.15–1.40)
HCT: 42 % (ref 36.0–46.0)
Hemoglobin: 14.3 g/dL (ref 12.0–15.0)
O2 Saturation: 99 %
Potassium: 3.9 mmol/L (ref 3.5–5.1)
Sodium: 139 mmol/L (ref 135–145)
TCO2: 25 mmol/L (ref 22–32)
pCO2 arterial: 35.1 mmHg (ref 32–48)
pH, Arterial: 7.436 (ref 7.35–7.45)
pO2, Arterial: 141 mmHg — ABNORMAL HIGH (ref 83–108)

## 2022-11-02 SURGERY — RIGHT/LEFT HEART CATH AND CORONARY ANGIOGRAPHY
Anesthesia: LOCAL

## 2022-11-02 MED ORDER — ASPIRIN 81 MG PO CHEW
81.0000 mg | CHEWABLE_TABLET | ORAL | Status: AC
  Administered 2022-11-02: 81 mg via ORAL
  Filled 2022-11-02: qty 1

## 2022-11-02 MED ORDER — LIDOCAINE HCL (PF) 1 % IJ SOLN
INTRAMUSCULAR | Status: DC | PRN
  Administered 2022-11-02 (×2): 3 mL

## 2022-11-02 MED ORDER — SODIUM CHLORIDE 0.9 % IV SOLN: 250.0000 mL | INTRAVENOUS | Status: DC | PRN

## 2022-11-02 MED ORDER — HEPARIN SODIUM (PORCINE) 1000 UNIT/ML IJ SOLN
INTRAMUSCULAR | Status: DC | PRN
  Administered 2022-11-02: 3000 [IU] via INTRAVENOUS

## 2022-11-02 MED ORDER — SODIUM CHLORIDE 0.9 % WEIGHT BASED INFUSION: 1.0000 mL/kg/h | INTRAVENOUS | Status: DC

## 2022-11-02 MED ORDER — IOHEXOL 350 MG/ML SOLN
INTRAVENOUS | Status: DC | PRN
  Administered 2022-11-02: 50 mL

## 2022-11-02 MED ORDER — FENTANYL CITRATE (PF) 100 MCG/2ML IJ SOLN
INTRAMUSCULAR | Status: DC | PRN
  Administered 2022-11-02: 25 ug via INTRAVENOUS

## 2022-11-02 MED ORDER — MIDAZOLAM HCL 2 MG/2ML IJ SOLN
INTRAMUSCULAR | Status: DC | PRN
  Administered 2022-11-02: 1 mg via INTRAVENOUS

## 2022-11-02 MED ORDER — ONDANSETRON HCL 4 MG/2ML IJ SOLN: 4.0000 mg | Freq: Four times a day (QID) | INTRAMUSCULAR | Status: DC | PRN

## 2022-11-02 MED ORDER — LIDOCAINE HCL (PF) 1 % IJ SOLN
INTRAMUSCULAR | Status: AC
  Filled 2022-11-02: qty 30

## 2022-11-02 MED ORDER — MIDAZOLAM HCL 2 MG/2ML IJ SOLN
INTRAMUSCULAR | Status: AC
  Filled 2022-11-02: qty 2

## 2022-11-02 MED ORDER — SODIUM CHLORIDE 0.9% FLUSH: 3.0000 mL | Freq: Two times a day (BID) | INTRAVENOUS | Status: DC

## 2022-11-02 MED ORDER — VERAPAMIL HCL 2.5 MG/ML IV SOLN
INTRAVENOUS | Status: DC | PRN
  Administered 2022-11-02: 1.2 mL via INTRA_ARTERIAL

## 2022-11-02 MED ORDER — FENTANYL CITRATE (PF) 100 MCG/2ML IJ SOLN
INTRAMUSCULAR | Status: AC
  Filled 2022-11-02: qty 2

## 2022-11-02 MED ORDER — SODIUM CHLORIDE 0.9% FLUSH: 3.0000 mL | INTRAVENOUS | Status: DC | PRN

## 2022-11-02 MED ORDER — HEPARIN (PORCINE) IN NACL 1000-0.9 UT/500ML-% IV SOLN
INTRAVENOUS | Status: DC | PRN
  Administered 2022-11-02 (×2): 500 mL

## 2022-11-02 MED ORDER — VERAPAMIL HCL 2.5 MG/ML IV SOLN
INTRAVENOUS | Status: AC
  Filled 2022-11-02: qty 2

## 2022-11-02 MED ORDER — HEPARIN SODIUM (PORCINE) 1000 UNIT/ML IJ SOLN
INTRAMUSCULAR | Status: AC
  Filled 2022-11-02: qty 10

## 2022-11-02 MED ORDER — ACETAMINOPHEN 325 MG PO TABS: 650.0000 mg | ORAL_TABLET | ORAL | Status: DC | PRN

## 2022-11-02 MED ORDER — HYDRALAZINE HCL 20 MG/ML IJ SOLN: 10.0000 mg | INTRAMUSCULAR | Status: DC | PRN

## 2022-11-02 MED ORDER — SODIUM CHLORIDE 0.9 % WEIGHT BASED INFUSION
3.0000 mL/kg/h | INTRAVENOUS | Status: AC
  Administered 2022-11-02: 3 mL/kg/h via INTRAVENOUS

## 2022-11-02 MED ORDER — RIVAROXABAN 20 MG PO TABS: 20.0000 mg | ORAL_TABLET | Freq: Every day | ORAL | Status: AC

## 2022-11-02 SURGICAL SUPPLY — 12 items
BAND CMPR LRG ZPHR (HEMOSTASIS) ×1
BAND ZEPHYR COMPRESS 30 LONG (HEMOSTASIS) IMPLANT
CATH BALLN WEDGE 5F 110CM (CATHETERS) IMPLANT
CATH OPTITORQUE TIG 4.0 5F (CATHETERS) IMPLANT
GLIDESHEATH SLEND A-KIT 6F 22G (SHEATH) IMPLANT
GUIDEWIRE INQWIRE 1.5J.035X260 (WIRE) IMPLANT
INQWIRE 1.5J .035X260CM (WIRE) ×1
KIT HEART LEFT (KITS) ×1 IMPLANT
PACK CARDIAC CATHETERIZATION (CUSTOM PROCEDURE TRAY) ×1 IMPLANT
SHEATH GLIDE SLENDER 4/5FR (SHEATH) IMPLANT
TRANSDUCER W/STOPCOCK (MISCELLANEOUS) ×1 IMPLANT
TUBING CIL FLEX 10 FLL-RA (TUBING) ×1 IMPLANT

## 2022-11-02 NOTE — Interval H&P Note (Signed)
History and Physical Interval Note:  11/02/2022 12:27 PM  [name]  has presented today for surgery, with the diagnosis of hp - doe.  The various methods of treatment have been discussed with the patient and family. After consideration of risks, benefits and other options for treatment, the patient has consented to  Procedure(s): RIGHT/LEFT HEART CATH AND CORONARY ANGIOGRAPHY (N/A) as a surgical intervention.  The patient's history has been reviewed, patient examined, no change in status, stable for surgery.  I have reviewed the patient's chart and labs.  Questions were answered to the patient's satisfaction.     [name]

## 2022-11-06 ENCOUNTER — Encounter (HOSPITAL_COMMUNITY): Payer: Self-pay | Admitting: Cardiology

## 2022-11-14 ENCOUNTER — Ambulatory Visit: Payer: Medicare PPO

## 2022-11-14 DIAGNOSIS — R0989 Other specified symptoms and signs involving the circulatory and respiratory systems: Secondary | ICD-10-CM

## 2022-11-14 DIAGNOSIS — I1A0 Resistant hypertension: Secondary | ICD-10-CM

## 2022-11-14 DIAGNOSIS — I10 Essential (primary) hypertension: Secondary | ICD-10-CM | POA: Diagnosis not present

## 2022-11-19 DIAGNOSIS — I10 Essential (primary) hypertension: Secondary | ICD-10-CM | POA: Diagnosis not present

## 2022-11-19 DIAGNOSIS — C44729 Squamous cell carcinoma of skin of left lower limb, including hip: Secondary | ICD-10-CM | POA: Diagnosis not present

## 2022-11-19 DIAGNOSIS — C4492 Squamous cell carcinoma of skin, unspecified: Secondary | ICD-10-CM | POA: Diagnosis not present

## 2022-11-19 DIAGNOSIS — Z299 Encounter for prophylactic measures, unspecified: Secondary | ICD-10-CM | POA: Diagnosis not present

## 2022-11-19 DIAGNOSIS — D485 Neoplasm of uncertain behavior of skin: Secondary | ICD-10-CM | POA: Diagnosis not present

## 2022-11-21 ENCOUNTER — Encounter: Payer: Self-pay | Admitting: Cardiology

## 2022-11-21 ENCOUNTER — Ambulatory Visit: Payer: Medicare PPO | Admitting: Cardiology

## 2022-11-21 VITALS — BP 163/88 | HR 77 | Resp 16 | Ht 69.0 in | Wt 154.8 lb

## 2022-11-21 DIAGNOSIS — I340 Nonrheumatic mitral (valve) insufficiency: Secondary | ICD-10-CM | POA: Diagnosis not present

## 2022-11-21 DIAGNOSIS — I1A0 Resistant hypertension: Secondary | ICD-10-CM

## 2022-11-21 DIAGNOSIS — I4821 Permanent atrial fibrillation: Secondary | ICD-10-CM | POA: Diagnosis not present

## 2022-11-21 DIAGNOSIS — I6522 Occlusion and stenosis of left carotid artery: Secondary | ICD-10-CM | POA: Diagnosis not present

## 2022-11-21 MED ORDER — VALSARTAN 80 MG PO TABS: 80.0000 mg | ORAL_TABLET | Freq: Every evening | ORAL | 2 refills | Status: DC

## 2022-11-21 MED ORDER — SPIRONOLACTONE 25 MG PO TABS
25.0000 mg | ORAL_TABLET | Freq: Every day | ORAL | 3 refills | Status: DC
Start: 2022-11-21 — End: 2023-07-17

## 2022-11-21 NOTE — Progress Notes (Signed)
Primary Physician/Referring:  Ignatius Specking, MD  Patient ID: [name], female    DOB: Jan 22, 1946, 77 y.o.   MRN: [id number]  Chief Complaint  Patient presents with   Permanent atrial fibrillation   Follow-up   HPI:    [name]  is a 77 y.o. Female patient with persistent atrial fibrillation, primary hypertension, moderate to severe Mitral Regurgitation, resistant hypertension, underwent TEE, left and right heart catheterization in May 2024, presents for follow-up.  Over the past 5 to 6 months she has noticed worsening dyspnea and leg edema.  On her last office visit her physical examination was consistent with severe MR, I set her up for echocardiogram which confirmed severe MR hence I performed TEE and right and left heart catheterization she now presents for follow-up.    I had started her on spironolactone, she has had complete resolution of leg edema.  States that her blood pressure was also very well-controlled but she discontinued taking it as she felt well, dyspnea resolved and leg edema completely resolved.  Her husband is present.  Denies chest pain, palpitations, PND or orthopnea.  Past Medical History:  Diagnosis Date   Atrial fibrillation (HCC) 2011   Breast cancer [facility]) 2003   Right lumpectomy and sentinel lymph node biopsy December 2003 for a pT2 pN0, stage IIA  invasive ductal carcinoma, grade 3   Cataracts, bilateral    Essential hypertension    Hypothyroidism    Venous insufficiency    Past Surgical History:  Procedure Laterality Date   BREAST TUMOR Right 06/11/2002   EXCISION MASS LOWER EXTREMETIES Left 12/31/2016   Procedure: EXCISION MASS OF LEFT THIGH;  Surgeon: Louisa Second, MD;  Location: [facility];  Service: Plastics;  Laterality: Left;   LIPOSUCTION Left 12/31/2016   Procedure: LIPOSUCTION ASSISTANCE;  Surgeon: Louisa Second, MD;  Location: [facility];  Service: Plastics;  Laterality: Left;    RECONSTRUCTION OF NOSE  06/12/1983   RIGHT/LEFT HEART CATH AND CORONARY ANGIOGRAPHY N/A 11/02/2022   Procedure: RIGHT/LEFT HEART CATH AND CORONARY ANGIOGRAPHY;  Surgeon: [name], MD;  Location: [facility] INVASIVE CV LAB;  Service: Cardiovascular;  Laterality: N/A;   TEE WITHOUT CARDIOVERSION N/A 10/19/2022   Procedure: TRANSESOPHAGEAL ECHOCARDIOGRAM;  Surgeon: [name], MD;  Location: [facility] INVASIVE CV LAB;  Service: Cardiovascular;  Laterality: N/A;   TONSILLECTOMY  06/12/1951   TUBAL LIGATION     Family History  Problem Relation Age of Onset   Stroke Mother    Atrial fibrillation Mother    Thyroid disease Mother    Heart attack Father    Diabetes Father    Hypertension Brother    Other Paternal Grandmother        rare blood disease   Heart attack Paternal Grandfather    Esophageal cancer Neg Hx     Social History   Tobacco Use   Smoking status: Never   Smokeless tobacco: Never  Substance Use Topics   Alcohol use: Yes    Alcohol/week: 1.0 standard drink of alcohol    Types: 1 Glasses of wine per week    Comment: 1-2 glasses weekly   Marital Status: Single  ROS  Review of Systems  Cardiovascular:  Negative for chest pain, dyspnea on exertion and leg swelling.   Objective      11/21/2022   11:25 AM 11/02/2022    3:09 PM 11/02/2022    2:39 PM  Vitals with BMI  Height 5\' 9"   Weight 154 lbs 13 oz    BMI 22.85    Systolic 163 131 161  Diastolic 88 68 67  Pulse 77 66 63   SpO2: 98 %  Physical Exam Neck:     Vascular: Carotid bruit (left) present. No JVD.  Cardiovascular:     Rate and Rhythm: Normal rate and regular rhythm.     Pulses: Normal pulses and intact distal pulses.     Heart sounds: S1 normal and S2 normal. Murmur heard.     Midsystolic murmur is present with a grade of 2/6 at the apex.     No gallop.     Comments: Prominent abdominal bruit Pulmonary:     Effort: Pulmonary effort is normal.     Breath sounds: Normal breath sounds.  Abdominal:      General: Bowel sounds are normal.     Palpations: Abdomen is soft.  Musculoskeletal:     Right lower leg: No edema.     Left lower leg: No edema.    Laboratory examination:      Latest Ref Rng & Units 11/02/2022    1:00 PM 11/02/2022   12:59 PM 11/02/2022   12:54 PM  CMP  Sodium 135 - 145 mmol/L 140  140  139   Potassium 3.5 - 5.1 mmol/L 3.8  3.8  3.9       Latest Ref Rng & Units 11/02/2022    1:00 PM 11/02/2022   12:59 PM 11/02/2022   12:54 PM  CBC  Hemoglobin 12.0 - 15.0 g/dL 09.6  04.5  40.9   Hematocrit 36.0 - 46.0 % 41.0  41.0  42.0    External labs:   06/28/2022  Hemoglobin 14.6, platelets 336, BUN 8, creatinine 0.65, potassium 4.6, AST 22, ALT 16,  EGFR 91 mL  T. cholesterol 185, triglycerides 66, HDL 49, LDL 124,   TSH 2.56, hemoglobin A1c 5.9%  Ref Range & Units 10/17/2022 Aldosterone   0.0 - 30.0 ng/dL                       <8.1 Renin Activity, Plasma 0.167 - 5.380 ng/mL/hr 0.306 Aldos/Renin Ratio 0.0 - 30.0                                    <3.3 Radiology:   X-ray two-view 10/09/2022: The heart size and mediastinal contours are within normal limits. Both lungs are clear. The visualized skeletal structures are unremarkable. Cardiac Studies:   Echocardiogram 11/03/2022: Normal LV size, normal LV wall thickness, EF 50 to 55%.  Normal diastolic function.  Gated LVEF 67%. Left atrium is normal in size. Right atrial cavity is moderately dilated. Mild aortic valve sclerosis.  No aortic regurgitation. Structurally normal-appearing mitral valve with moderate to severe mitral regurgitation.  Large central MR, dense irregular color-flow jet. Structurally normal-appearing tricuspid valve with severe tricuspid regurgitation.  Severe pulmonary hypertension, RV systolic pressure 66 mmHg. IVC is normal with normal respiratory response.  Carotid artery duplex 11/14/2022: No evidence of significant stenosis in the right carotid vessels. Duplex suggests stenosis in the  left distal internal carotid artery (16-49%) with homogeneous plaque. Antegrade right vertebral artery flow. Antegrade left vertebral artery flow. Follow up in one year is appropriate if clinically indicated  Renal artery duplex  11/14/2022: No evidence of renal artery occlusive disease in either renal artery. Normal intrarenal vascular perfusion is noted in the right  kidney. Mild increase in the resistivity index left kidney and suggests medico-renal disease.  Renal length is within normal limits for both kidneys. Normal abdominal aorta flow velocities noted.  TEE 10/19/2022: 1. Left ventricular ejection fraction, by estimation, is 60 to 65%. The left ventricle has normal function. The left ventricle has no regional wall motion abnormalities.  2. Right ventricular systolic function is normal. The right ventricular size is mildly enlarged. There is moderately elevated pulmonary artery systolic pressure. The estimated right ventricular systolic pressure is 53.4 mmHg.  3. Left atrial size was severely dilated. No left atrial/left atrial appendage thrombus was detected.  4. Right atrial size was severely dilated.  5. 2 jets noted central. The mitral valve is normal in structure. Mild to moderate mitral valve regurgitation.  6. Tricuspid valve regurgitation is moderate.  7. The aortic valve is normal in structure. Aortic valve regurgitation is not visualized. No aortic stenosis is present.  8. There is mild (Grade II) layered plaque involving the aortic arch and descending aorta.  Left & heart catheterization 11/02/2022: LV: 160/4, EDP 19 mmHg.  Moderate replaced by 2 regurgitation.  LA appears markedly dilated. LM: Large-caliber vessel, smooth and normal. LAD: Large-caliber vessel, tortuous in the mid and distal segment.  Gives origin to a moderate-sized D1 and a moderate to large D2, smooth and normal. Cx: Moderate caliber vessel.  Gives origin to high OM1.  Smooth and normal. RCA:  Large-caliber vessel.  Large PDA branches (2 and large PL branch, smooth and normal.   Right heart hemodynamic data: RA 13/13, mean 11 mmHg.  RV 63/5, EDP 12 mmHg.  PA 65/23, mean 45 mmHg.  PA saturation 80%.  PW 23/42, mean 25 mmHg.  Aortic saturation 99%. QP/QS 1.00.  CO 6.31, CI 3.45.  PVR 3.17 Wood units.   Impression: Moderate MR by angiography however right heart data, PW reveals giant V waves suggestive of severe MR. Moderate to severe pulmonary hypertension secondary to elevated EDP.      Recommendation: In view of the above findings, mitral valve repair would be appropriate although TEE had revealed only mild to moderate MR, done under nonphysiologic conditions including deep sedation with propofol.  She also has clinical evidence of heart failure, worsening dyspnea and no other etiology for elevated PA pressures.  EKG:   EKG 10/09/2022: Atrial fibrillation with controlled ventricular response at the rate of 79 bpm, no evidence of ischemia.  Compared to prior EKG no significant change.  EKG 08/01/2022: Atrial fibrillation with controlled ventricular response at the rate of 69 bpm, no evidence of ischemia.  Medications and allergies   Allergies  Allergen Reactions   Penicillins     As a child.   Sulfonamide Derivatives Other (See Comments)    Childhood     Medication list   Current Outpatient Medications:    ALOE VERA PO, Take 1 Capful by mouth daily. Liquid, Disp: , Rfl:    Berberine Chloride (BERBERINE HCI) 500 MG CAPS, Take 500 mg by mouth 2 (two) times daily. Glucogold, Disp: , Rfl:    Capsicum, Cayenne, (CAYENNE PO), Take 15 drops by mouth daily. Liquid, Disp: , Rfl:    Cholecalciferol (VITAMIN D3) LIQD, Take 7-8 drops by mouth daily. 10,000 Units per drop, Disp: , Rfl:    Coenzyme Q10 (COQ10) 100 MG CAPS, Take 300 mg by mouth daily. With PQQ, Disp: , Rfl:    Cyanocobalamin (VITAMIN B-12) 1000 MCG/15ML LIQD, Take by mouth daily. 1 Full dropper, Disp: , Rfl:  D-Ribose POWD, Take 500 mg by mouth daily. 1 Scoop, Disp: , Rfl:    hydrALAZINE (APRESOLINE) 25 MG tablet, Take 1 tablet (25 mg total) by mouth 3 (three) times daily. (Patient taking differently: Take 25 mg by mouth 2 (two) times daily.), Disp: 270 tablet, Rfl: 1   L-Lysine 500 MG CAPS, Take 500 mg by mouth daily., Disp: , Rfl:    levOCARNitine L-Tartrate (L-CARNITINE) 500 MG CAPS, Take 500 mg by mouth daily., Disp: , Rfl:    levothyroxine (SYNTHROID) 137 MCG tablet, Take 137 mcg by mouth every morning., Disp: , Rfl:    liothyronine (CYTOMEL) 5 MCG tablet, Take 5 mcg by mouth every morning., Disp: , Rfl:    Magnesium 500 MG CAPS, Take 500 mg by mouth daily. Complex, Disp: , Rfl:    metoprolol succinate (TOPROL-XL) 50 MG 24 hr tablet, Take 50 mg by mouth 2 (two) times daily., Disp: , Rfl:    MISC NATURAL PRODUCTS EX, Apply 1 application  topically daily. Bi-Est 80/20 cream - 5mg /ml - 1/70ml (2 clicks), Disp: , Rfl:    MISC NATURAL PRODUCTS PO, Take 500 mg by mouth daily. AMPK Metabolic Activator - Hesperidin, Disp: , Rfl:    Multiple Vitamin (MULTIVITAMIN) LIQD, Take 5 mLs by mouth daily. Capful, Disp: , Rfl:    Naltrexone HCl, Pain, 4.5 MG CAPS, Take 4.5 mg by mouth at bedtime., Disp: , Rfl:    NATTOKINASE PO, Take 1 tablet by mouth daily., Disp: , Rfl:    Omega-3 Fatty Acids (EQL OMEGA 3 FISH OIL) 1000 MG CPDR, Take 2,000 mg by mouth daily., Disp: , Rfl:    OVER THE COUNTER MEDICATION, Take 1 Scoop by mouth daily. Super Beets Powder, Disp: , Rfl:    OVER THE COUNTER MEDICATION, Take 1 Scoop by mouth daily. M C I, Disp: , Rfl:    Pomegranate, Punica granatum, (POMEGRANATE PO), Take 500 mg by mouth daily., Disp: , Rfl:    Probiotic Product (PROBIOTIC DAILY PO), Take 1 tablet by mouth daily., Disp: , Rfl:    Progesterone Micronized (PROGESTERONE PO), Take 1 tablet by mouth at bedtime., Disp: , Rfl:    rivaroxaban (XARELTO) 20 MG TABS tablet, Take 1 tablet (20 mg total) by mouth daily with supper.,  Disp: 30 tablet, Rfl:    Taurine 1000 MG CAPS, Take 1,000 mg by mouth daily., Disp: , Rfl:    valsartan (DIOVAN) 80 MG tablet, Take 1 tablet (80 mg total) by mouth every evening., Disp: 30 tablet, Rfl: 2   rosuvastatin (CRESTOR) 10 MG tablet, Take 1 tablet (10 mg total) by mouth daily. (Patient not taking: Reported on 10/18/2022), Disp: 30 tablet, Rfl: 2   spironolactone (ALDACTONE) 25 MG tablet, Take 1 tablet (25 mg total) by mouth daily., Disp: 90 tablet, Rfl: 3  Assessment     ICD-10-CM   1. Permanent atrial fibrillation (HCC)  I48.21 EKG 12-Lead    PCV ECHOCARDIOGRAM COMPLETE    2. Severe mitral regurgitation  I34.0 PCV ECHOCARDIOGRAM COMPLETE    3. Asymptomatic stenosis of left carotid artery  I65.22     4. Resistant hypertension  I1A.0 valsartan (DIOVAN) 80 MG tablet    spironolactone (ALDACTONE) 25 MG tablet    Basic metabolic panel     CHA2DS2-VASc Score is 4.  Yearly risk of stroke: 5% (A, F, HTN, Vasc Dz).  Score of 1=0.6; 2=2.2; 3=3.2; 4=4.8; 5=7.2; 6=9.8; 7=>9.8) -(CHF; HTN; vasc disease DM,  Female = 1; Age <65 =0; 65-74 = 1,  >  75 =2; stroke/embolism= 2).    Orders Placed This Encounter  Procedures   Basic metabolic panel   EKG 12-Lead   PCV ECHOCARDIOGRAM COMPLETE    Standing Status:   Future    Standing Expiration Date:   11/21/2023   Meds ordered this encounter  Medications   valsartan (DIOVAN) 80 MG tablet    Sig: Take 1 tablet (80 mg total) by mouth every evening.    Dispense:  30 tablet    Refill:  2   spironolactone (ALDACTONE) 25 MG tablet    Sig: Take 1 tablet (25 mg total) by mouth daily.    Dispense:  90 tablet    Refill:  3   Medications Discontinued During This Encounter  Medication Reason   spironolactone (ALDACTONE) 25 MG tablet Reorder      Recommendations:   [name] is a 77 y.o. Female patient with persistent atrial fibrillation, primary hypertension, moderate to severe Mitral Regurgitation, resistant hypertension, underwent TEE,  left and right heart catheterization in May 2024, presents for follow-up.  1. Permanent atrial fibrillation [facility]) Patient with permanent atrial fibrillation, presently rate controlled, continue present medical therapy.  Continue anticoagulation although her CHA2DS2-VASc score is high at 4.0. - EKG 12-Lead - PCV ECHOCARDIOGRAM COMPLETE; Future  2. Severe mitral regurgitation Patient has at least moderately severe mitral regurgitation both by physical exam and prior right heart catheterization, although she may have moderate to severe MR, no clinical indication to proceed with mitral valve repair.  At this point I have recommended continued observation, repeating echocardiogram in 6 months and management of hypertension. - PCV ECHOCARDIOGRAM COMPLETE; Future  3. Asymptomatic stenosis of left carotid artery Very mild left carotid stenosis, continue statins, on Crestor 10 and will need f/u lipids at some point and surveillance duplex for carotid stenosis.  4. Resistant hypertension Renal duplex was negative for renal artery stenosis, aldosterone to renin ratio was also normal.  Patient is presently on metoprolol, spironolactone for hypertension however patient only took spironolactone for leg edema and stopped after her leg edema completely resolved.  Advised her to restart spironolactone on a daily basis, I would like to start her on valsartan 80 mg in the evening, will obtain BMP in 2 to 3 weeks.  Patient will maintain a log of blood pressure, states that when she was taking spironolactone, blood pressure was excellent control with systolic blood pressure <130 mmHg.  Overall her symptoms of dyspnea also improved and leg edema has completely subsided.  Advised her to restart the medication.  I would like to see her back in 6 months for follow-up, she will continue to keep me posted on MyChart regarding her blood pressure.  She is advised to contact me if she were to develop dyspnea, leg edema  recurrence, palpitations or if blood pressure is not able to be controlled. - valsartan (DIOVAN) 80 MG tablet; Take 1 tablet (80 mg total) by mouth every evening.  Dispense: 30 tablet; Refill: 2 - spironolactone (ALDACTONE) 25 MG tablet; Take 1 tablet (25 mg total) by mouth daily.  Dispense: 90 tablet; Refill: 3 - Basic metabolic panel  This is a 40-minute office visit encounter.  [name], MD, [facility] 11/21/2022, 9:31 PM Office: [phone]

## 2022-11-24 DIAGNOSIS — C4492 Squamous cell carcinoma of skin, unspecified: Secondary | ICD-10-CM | POA: Diagnosis not present

## 2022-11-24 DIAGNOSIS — L0390 Cellulitis, unspecified: Secondary | ICD-10-CM | POA: Diagnosis not present

## 2022-11-24 DIAGNOSIS — R21 Rash and other nonspecific skin eruption: Secondary | ICD-10-CM | POA: Diagnosis not present

## 2022-12-05 DIAGNOSIS — Z299 Encounter for prophylactic measures, unspecified: Secondary | ICD-10-CM | POA: Diagnosis not present

## 2022-12-05 DIAGNOSIS — C4492 Squamous cell carcinoma of skin, unspecified: Secondary | ICD-10-CM | POA: Diagnosis not present

## 2022-12-05 DIAGNOSIS — L089 Local infection of the skin and subcutaneous tissue, unspecified: Secondary | ICD-10-CM | POA: Diagnosis not present

## 2022-12-05 DIAGNOSIS — I10 Essential (primary) hypertension: Secondary | ICD-10-CM | POA: Diagnosis not present

## 2022-12-05 DIAGNOSIS — T148XXA Other injury of unspecified body region, initial encounter: Secondary | ICD-10-CM | POA: Diagnosis not present

## 2022-12-06 DIAGNOSIS — L309 Dermatitis, unspecified: Secondary | ICD-10-CM | POA: Diagnosis not present

## 2022-12-06 DIAGNOSIS — C44729 Squamous cell carcinoma of skin of left lower limb, including hip: Secondary | ICD-10-CM | POA: Diagnosis not present

## 2022-12-06 DIAGNOSIS — L538 Other specified erythematous conditions: Secondary | ICD-10-CM | POA: Diagnosis not present

## 2022-12-06 DIAGNOSIS — L448 Other specified papulosquamous disorders: Secondary | ICD-10-CM | POA: Diagnosis not present

## 2022-12-18 DIAGNOSIS — R799 Abnormal finding of blood chemistry, unspecified: Secondary | ICD-10-CM | POA: Diagnosis not present

## 2022-12-18 DIAGNOSIS — B2790 Infectious mononucleosis, unspecified without complication: Secondary | ICD-10-CM | POA: Diagnosis not present

## 2022-12-18 DIAGNOSIS — I4891 Unspecified atrial fibrillation: Secondary | ICD-10-CM | POA: Diagnosis not present

## 2022-12-18 DIAGNOSIS — Z0000 Encounter for general adult medical examination without abnormal findings: Secondary | ICD-10-CM | POA: Diagnosis not present

## 2022-12-18 DIAGNOSIS — E063 Autoimmune thyroiditis: Secondary | ICD-10-CM | POA: Diagnosis not present

## 2022-12-18 DIAGNOSIS — R7303 Prediabetes: Secondary | ICD-10-CM | POA: Diagnosis not present

## 2022-12-18 DIAGNOSIS — R5382 Chronic fatigue, unspecified: Secondary | ICD-10-CM | POA: Diagnosis not present

## 2022-12-18 DIAGNOSIS — Z780 Asymptomatic menopausal state: Secondary | ICD-10-CM | POA: Diagnosis not present

## 2022-12-18 DIAGNOSIS — D510 Vitamin B12 deficiency anemia due to intrinsic factor deficiency: Secondary | ICD-10-CM | POA: Diagnosis not present

## 2022-12-19 DIAGNOSIS — H2513 Age-related nuclear cataract, bilateral: Secondary | ICD-10-CM | POA: Diagnosis not present

## 2022-12-19 DIAGNOSIS — Z0100 Encounter for examination of eyes and vision without abnormal findings: Secondary | ICD-10-CM | POA: Diagnosis not present

## 2023-01-01 DIAGNOSIS — R799 Abnormal finding of blood chemistry, unspecified: Secondary | ICD-10-CM | POA: Diagnosis not present

## 2023-01-23 DIAGNOSIS — I1A0 Resistant hypertension: Secondary | ICD-10-CM | POA: Diagnosis not present

## 2023-01-24 ENCOUNTER — Other Ambulatory Visit: Payer: Self-pay | Admitting: Cardiology

## 2023-01-24 DIAGNOSIS — I1A0 Resistant hypertension: Secondary | ICD-10-CM

## 2023-01-24 DIAGNOSIS — E871 Hypo-osmolality and hyponatremia: Secondary | ICD-10-CM

## 2023-01-25 DIAGNOSIS — J029 Acute pharyngitis, unspecified: Secondary | ICD-10-CM | POA: Diagnosis not present

## 2023-01-25 DIAGNOSIS — U071 COVID-19: Secondary | ICD-10-CM | POA: Diagnosis not present

## 2023-01-25 DIAGNOSIS — Z299 Encounter for prophylactic measures, unspecified: Secondary | ICD-10-CM | POA: Diagnosis not present

## 2023-01-28 ENCOUNTER — Encounter: Payer: Self-pay | Admitting: Cardiology

## 2023-01-29 NOTE — Telephone Encounter (Signed)
From patient.

## 2023-02-08 DIAGNOSIS — C44729 Squamous cell carcinoma of skin of left lower limb, including hip: Secondary | ICD-10-CM | POA: Diagnosis not present

## 2023-02-08 DIAGNOSIS — D485 Neoplasm of uncertain behavior of skin: Secondary | ICD-10-CM | POA: Diagnosis not present

## 2023-02-11 ENCOUNTER — Other Ambulatory Visit: Payer: Self-pay | Admitting: Cardiology

## 2023-02-11 DIAGNOSIS — I1A0 Resistant hypertension: Secondary | ICD-10-CM

## 2023-02-26 DIAGNOSIS — H0011 Chalazion right upper eyelid: Secondary | ICD-10-CM | POA: Diagnosis not present

## 2023-02-26 DIAGNOSIS — E871 Hypo-osmolality and hyponatremia: Secondary | ICD-10-CM | POA: Diagnosis not present

## 2023-02-26 DIAGNOSIS — I1A0 Resistant hypertension: Secondary | ICD-10-CM | POA: Diagnosis not present

## 2023-03-12 DIAGNOSIS — C44729 Squamous cell carcinoma of skin of left lower limb, including hip: Secondary | ICD-10-CM | POA: Diagnosis not present

## 2023-03-12 DIAGNOSIS — I872 Venous insufficiency (chronic) (peripheral): Secondary | ICD-10-CM | POA: Diagnosis not present

## 2023-04-02 DIAGNOSIS — H0012 Chalazion right lower eyelid: Secondary | ICD-10-CM | POA: Diagnosis not present

## 2023-04-02 DIAGNOSIS — H0011 Chalazion right upper eyelid: Secondary | ICD-10-CM | POA: Diagnosis not present

## 2023-04-08 DIAGNOSIS — Z1231 Encounter for screening mammogram for malignant neoplasm of breast: Secondary | ICD-10-CM | POA: Diagnosis not present

## 2023-05-09 ENCOUNTER — Other Ambulatory Visit: Payer: Self-pay | Admitting: Cardiology

## 2023-05-09 DIAGNOSIS — I1A0 Resistant hypertension: Secondary | ICD-10-CM

## 2023-05-20 ENCOUNTER — Other Ambulatory Visit: Payer: Medicare PPO

## 2023-05-21 ENCOUNTER — Ambulatory Visit (HOSPITAL_COMMUNITY): Payer: Medicare PPO | Attending: Cardiology

## 2023-05-21 DIAGNOSIS — I4821 Permanent atrial fibrillation: Secondary | ICD-10-CM | POA: Insufficient documentation

## 2023-05-21 DIAGNOSIS — I340 Nonrheumatic mitral (valve) insufficiency: Secondary | ICD-10-CM | POA: Diagnosis not present

## 2023-05-21 LAB — ECHOCARDIOGRAM COMPLETE
Area-P 1/2: 4.6 cm2
MV M vel: 5.09 m/s
MV Peak grad: 103.4 mm[Hg]
S' Lateral: 2.4 cm

## 2023-05-22 NOTE — Progress Notes (Signed)
Discuss on OV soon in Feb. Stable echo with improved mitral regurgitation and mild worsening TR, but stable right heart pressure.

## 2023-05-23 ENCOUNTER — Other Ambulatory Visit (HOSPITAL_COMMUNITY): Payer: Medicare PPO

## 2023-05-23 ENCOUNTER — Other Ambulatory Visit: Payer: Medicare PPO

## 2023-05-27 ENCOUNTER — Ambulatory Visit: Payer: Self-pay | Admitting: Cardiology

## 2023-06-11 ENCOUNTER — Telehealth: Payer: Self-pay | Admitting: Cardiology

## 2023-06-11 NOTE — Telephone Encounter (Signed)
Pt called in stating she received a denial letter for auth for echo and she would like our office to file an appeal, if possible.    She states the letter came from [employer] alongside [employer].

## 2023-06-14 ENCOUNTER — Encounter: Payer: Self-pay | Admitting: Cardiology

## 2023-06-14 NOTE — Telephone Encounter (Addendum)
 Please call on her cell at [phone] if no answer try her home # [phone].

## 2023-06-14 NOTE — Telephone Encounter (Signed)
 [name] in precert has submitted an appeal by fax and is trying to reach [facility] by phone.  She will contact patient once she hears back from [facility].

## 2023-06-14 NOTE — Telephone Encounter (Signed)
 Pt called to f/u on getting a callback regarding the denial letter for Auth for Echo. She'd like to be contacted on her home phone [phone]. Please advise.

## 2023-06-14 NOTE — Telephone Encounter (Signed)
 I spoke with patient and let her know precert department has submitted and appeal and is waiting to for a reply from [facility].  I let her know our precert person would contact her once we hear back from [facility]

## 2023-06-18 DIAGNOSIS — L723 Sebaceous cyst: Secondary | ICD-10-CM | POA: Diagnosis not present

## 2023-06-18 DIAGNOSIS — M8580 Other specified disorders of bone density and structure, unspecified site: Secondary | ICD-10-CM | POA: Diagnosis not present

## 2023-06-18 DIAGNOSIS — Z124 Encounter for screening for malignant neoplasm of cervix: Secondary | ICD-10-CM | POA: Diagnosis not present

## 2023-06-18 DIAGNOSIS — Z1211 Encounter for screening for malignant neoplasm of colon: Secondary | ICD-10-CM | POA: Diagnosis not present

## 2023-06-18 DIAGNOSIS — Z01419 Encounter for gynecological examination (general) (routine) without abnormal findings: Secondary | ICD-10-CM | POA: Diagnosis not present

## 2023-06-18 DIAGNOSIS — L989 Disorder of the skin and subcutaneous tissue, unspecified: Secondary | ICD-10-CM | POA: Diagnosis not present

## 2023-06-18 DIAGNOSIS — Z1239 Encounter for other screening for malignant neoplasm of breast: Secondary | ICD-10-CM | POA: Diagnosis not present

## 2023-06-27 DIAGNOSIS — R7303 Prediabetes: Secondary | ICD-10-CM | POA: Diagnosis not present

## 2023-06-27 DIAGNOSIS — R5382 Chronic fatigue, unspecified: Secondary | ICD-10-CM | POA: Diagnosis not present

## 2023-06-27 DIAGNOSIS — R799 Abnormal finding of blood chemistry, unspecified: Secondary | ICD-10-CM | POA: Diagnosis not present

## 2023-06-27 DIAGNOSIS — Z780 Asymptomatic menopausal state: Secondary | ICD-10-CM | POA: Diagnosis not present

## 2023-06-27 DIAGNOSIS — B2790 Infectious mononucleosis, unspecified without complication: Secondary | ICD-10-CM | POA: Diagnosis not present

## 2023-06-27 DIAGNOSIS — I4891 Unspecified atrial fibrillation: Secondary | ICD-10-CM | POA: Diagnosis not present

## 2023-06-27 DIAGNOSIS — E063 Autoimmune thyroiditis: Secondary | ICD-10-CM | POA: Diagnosis not present

## 2023-06-27 DIAGNOSIS — Z0000 Encounter for general adult medical examination without abnormal findings: Secondary | ICD-10-CM | POA: Diagnosis not present

## 2023-06-27 DIAGNOSIS — D510 Vitamin B12 deficiency anemia due to intrinsic factor deficiency: Secondary | ICD-10-CM | POA: Diagnosis not present

## 2023-07-09 ENCOUNTER — Other Ambulatory Visit: Payer: Self-pay | Admitting: Cardiology

## 2023-07-09 DIAGNOSIS — I1A0 Resistant hypertension: Secondary | ICD-10-CM

## 2023-07-17 ENCOUNTER — Ambulatory Visit: Payer: Medicare PPO | Attending: Cardiology | Admitting: Cardiology

## 2023-07-17 ENCOUNTER — Encounter: Payer: Self-pay | Admitting: Cardiology

## 2023-07-17 VITALS — BP 146/80 | HR 95 | Resp 16 | Ht 69.0 in | Wt 161.2 lb

## 2023-07-17 DIAGNOSIS — I340 Nonrheumatic mitral (valve) insufficiency: Secondary | ICD-10-CM | POA: Diagnosis not present

## 2023-07-17 DIAGNOSIS — I4821 Permanent atrial fibrillation: Secondary | ICD-10-CM

## 2023-07-17 DIAGNOSIS — I071 Rheumatic tricuspid insufficiency: Secondary | ICD-10-CM | POA: Diagnosis not present

## 2023-07-17 DIAGNOSIS — I10 Essential (primary) hypertension: Secondary | ICD-10-CM | POA: Diagnosis not present

## 2023-07-17 DIAGNOSIS — I5032 Chronic diastolic (congestive) heart failure: Secondary | ICD-10-CM

## 2023-07-17 NOTE — Progress Notes (Signed)
 Cardiology Office Note:  .   Date:  07/17/2023  ID:  [name], DOB Jul 19, 1945, MRN [id number] PCP: Anna Leta NOVAK, MD  [facility] Providers Cardiologist:  Gordy Bergamo, MD   History of Present Illness: .   [name] is a 78 y.o. Female patient with persistent atrial fibrillation, primary hypertension, moderate Mitral and tricuspid regurgitation regurgitation, resistant hypertension, presents here for a 34-month office visit.  She was in acute decompensated heart failure but with initiation of heart failure therapy especially spironolactone , symptoms of leg edema and dyspnea completely resolved and she opted for medical therapy.  Discussed the use of AI scribe software for clinical note transcription with the patient, who gave verbal consent to proceed.  History of Present Illness   [name] is a 78 year old female with atrial fibrillation and hypertension who presents for follow-up regarding her cardiac conditions.  She is currently taking metoprolol 50 mg twice daily for atrial fibrillation, along with rivaroxaban . For hypertension, she is on valsartan  80 mg daily. Her blood pressure varies, sometimes reaching as high as 160/90, but is often around 119/69. She takes spironolactone  as needed, primarily when she notices swelling, but does not take it daily unless she perceives a need. She recalls having leg swelling when spironolactone  was initially recommended, but currently, she does not experience much swelling.  No significant leg swelling or shortness of breath recently. Her breathing is reported as okay.  She reports a weight gain of ten pounds over the holidays due to decreased physical activity.  In terms of her social history, she recently traveled to [place] with her family over Thanksgiving, where she swam with dolphins and learned to snorkel, activities she had never done before.  She had no chest pain, dyspnea.     Labs   Lab Results  Component Value  Date   NA 134 02/26/2023   K 4.4 02/26/2023   CO2 25 02/26/2023   GLUCOSE 120 (H) 02/26/2023   BUN 17 02/26/2023   CREATININE 0.66 02/26/2023   CALCIUM  9.4 02/26/2023   EGFR 90 02/26/2023   GFRNONAA >60 08/20/2008      Latest Ref Rng & Units 02/26/2023    3:41 PM 01/23/2023    3:59 PM 11/02/2022    1:00 PM  BMP  Glucose 70 - 99 mg/dL 879  91    BUN 8 - 27 mg/dL 17  15    Creatinine 9.42 - 1.00 mg/dL 9.33  9.17    BUN/Creat Ratio 12 - 28 26  18     Sodium 134 - 144 mmol/L 134  132  140   Potassium 3.5 - 5.2 mmol/L 4.4  4.9  3.8   Chloride 96 - 106 mmol/L 95  95    CO2 20 - 29 mmol/L 25  25    Calcium  8.7 - 10.3 mg/dL 9.4  8.9        Latest Ref Rng & Units 11/02/2022    1:00 PM 11/02/2022   12:59 PM 11/02/2022   12:54 PM  CBC  Hemoglobin 12.0 - 15.0 g/dL 86.0  86.0  85.6   Hematocrit 36.0 - 46.0 % 41.0  41.0  42.0    External Labs:  PCP Labs 02/26/2023:  Serum creatinine 0.660, potassium 4.4, EGFR 90 mL.  Review of Systems  Cardiovascular:  Negative for chest pain, dyspnea on exertion and leg swelling.   Physical Exam:   VS:  BP (!) 146/80 (BP Location: Left Arm, Patient Position:  Sitting, Cuff Size: Normal)   Pulse 95   Resp 16   Ht 5' 9 (1.753 m)   Wt 161 lb 3.2 oz (73.1 kg)   SpO2 98%   BMI 23.81 kg/m    Wt Readings from Last 3 Encounters:  07/17/23 161 lb 3.2 oz (73.1 kg)  11/21/22 154 lb 12.8 oz (70.2 kg)  11/02/22 150 lb (68 kg)    Physical Exam Neck:     Vascular: No JVD.  Cardiovascular:     Rate and Rhythm: Normal rate and regular rhythm.     Pulses: Intact distal pulses.     Heart sounds: S1 normal and S2 normal. Murmur heard.     Midsystolic murmur is present with a grade of 2/6 at the lower left sternal border.     No gallop.  Pulmonary:     Effort: Pulmonary effort is normal.     Breath sounds: Normal breath sounds.  Abdominal:     General: Bowel sounds are normal.     Palpations: Abdomen is soft.  Musculoskeletal:     Right lower leg: No  edema.     Left lower leg: No edema.     Studies Reviewed: SABRA    ECHOCARDIOGRAM COMPLETE 05/21/2023  1. Left ventricular ejection fraction, by estimation, is 60 to 65%. The left ventricle has normal function. The left ventricle has no regional wall motion abnormalities. 2. Severity of pulmonary hypertension by RVSP may be underestimated due to severely dilated RA causing equalization of pressures. . Right ventricular systolic function is normal. The right ventricular size is normal. There is moderately elevated pulmonary artery systolic pressure. The estimated right ventricular systolic pressure is 53.7 mmHg. 3. Left atrial size was moderately dilated. 4. Right atrial size was severely dilated. 5. The mitral valve is degenerative. Mild to moderate mitral valve regurgitation. No evidence of mitral stenosis. 6. Tricuspid valve regurgitation is severe. 7. The aortic valve is normal in structure. Aortic valve regurgitation is not visualized. No aortic stenosis is present. 8. The inferior vena cava is normal in size with greater than 50% respiratory variability, suggesting right atrial pressure of 3 mmHg.   Comparison(s): A prior study was performed on 10/19/2022. TEE reported moderate TR.   EKG:  EKG 10/09/2022: Atrial fibrillation with controlled ventricular response at the rate of 79 bpm, no evidence of ischemia.   Medications and allergies    Allergies  Allergen Reactions   Penicillins     As a child.   Sulfonamide Derivatives Other (See Comments)    Childhood     Current Outpatient Medications:    B-12 METHYLCOBALAMIN PO, Take by mouth., Disp: , Rfl:    Berberine Chloride (BERBERINE HCI) 500 MG CAPS, Take 500 mg by mouth 2 (two) times daily. Glucogold, Disp: , Rfl:    Capsicum, Cayenne, (CAYENNE PO), Take 15 drops by mouth daily. Liquid, Disp: , Rfl:    Cholecalciferol (VITAMIN D3) LIQD, Take 7-8 drops by mouth daily. 10,000 Units per drop, Disp: , Rfl:    Coenzyme Q10 (COQ10)  100 MG CAPS, Take 300 mg by mouth daily. With PQQ, Disp: , Rfl:    D-Ribose POWD, Take 500 mg by mouth daily. 1 Scoop, Disp: , Rfl:    hydrALAZINE  (APRESOLINE ) 25 MG tablet, Take 1 tablet (25 mg total) by mouth 3 (three) times daily. (Patient taking differently: Take 25 mg by mouth 2 (two) times daily.), Disp: 270 tablet, Rfl: 1   L-Lysine 500 MG CAPS, Take 500 mg by  mouth daily., Disp: , Rfl:    levOCARNitine L-Tartrate (L-CARNITINE) 500 MG CAPS, Take 500 mg by mouth daily., Disp: , Rfl:    levothyroxine (SYNTHROID) 137 MCG tablet, Take 137 mcg by mouth every morning., Disp: , Rfl:    liothyronine (CYTOMEL) 5 MCG tablet, Take 5 mcg by mouth every morning., Disp: , Rfl:    Magnesium 500 MG CAPS, Take 500 mg by mouth daily. Complex, Disp: , Rfl:    metoprolol succinate (TOPROL-XL) 50 MG 24 hr tablet, Take 50 mg by mouth 2 (two) times daily., Disp: , Rfl:    MISC NATURAL PRODUCTS EX, Apply 1 application  topically daily. Bi-Est 80/20 cream - 5mg /ml - 1/72ml (2 clicks), Disp: , Rfl:    MISC NATURAL PRODUCTS PO, Take 500 mg by mouth daily. AMPK Metabolic Activator - Hesperidin, Disp: , Rfl:    Multiple Vitamin (MULTIVITAMIN) LIQD, Take 5 mLs by mouth daily. Capful, Disp: , Rfl:    Naltrexone HCl, Pain, 4.5 MG CAPS, Take 4.5 mg by mouth at bedtime., Disp: , Rfl:    NATTOKINASE PO, Take 1 tablet by mouth daily., Disp: , Rfl:    Omega-3 Fatty Acids (EQL OMEGA 3 FISH OIL) 1000 MG CPDR, Take 2,000 mg by mouth daily., Disp: , Rfl:    OVER THE COUNTER MEDICATION, Take 1 Scoop by mouth daily. Super Beets Powder, Disp: , Rfl:    OVER THE COUNTER MEDICATION, Take 1 Scoop by mouth daily. M C I, Disp: , Rfl:    Pomegranate, Punica granatum, (POMEGRANATE PO), Take 500 mg by mouth daily., Disp: , Rfl:    Probiotic Product (PROBIOTIC DAILY PO), Take 1 tablet by mouth daily., Disp: , Rfl:    Progesterone  Micronized (PROGESTERONE  PO), Take 1 tablet by mouth at bedtime., Disp: , Rfl:    rivaroxaban  (XARELTO ) 20 MG TABS  tablet, Take 1 tablet (20 mg total) by mouth daily with supper., Disp: 30 tablet, Rfl:    Taurine 1000 MG CAPS, Take 1,000 mg by mouth daily., Disp: , Rfl:    valsartan  (DIOVAN ) 80 MG tablet, TAKE 1 TABLET BY MOUTH EVERY EVENING, Disp: 30 tablet, Rfl: 4   spironolactone  (ALDACTONE ) 25 MG tablet, Take 1 tablet (25 mg total) by mouth every morning., Disp: , Rfl:    ASSESSMENT AND PLAN: .      ICD-10-CM   1. Permanent atrial fibrillation (HCC)  I48.21 ECHOCARDIOGRAM COMPLETE    2. Mild mitral regurgitation  I34.0 ECHOCARDIOGRAM COMPLETE    3. Primary hypertension  I10     4. Severe tricuspid regurgitation  I07.1 spironolactone  (ALDACTONE ) 25 MG tablet    ECHOCARDIOGRAM COMPLETE    5. Chronic diastolic heart failure (HCC)  P49.67 spironolactone  (ALDACTONE ) 25 MG tablet    ECHOCARDIOGRAM COMPLETE     Assessment and Plan    Permanent Atrial Fibrillation Chronic atrial fibrillation managed with metoprolol 50 mg twice daily for rate control and rivaroxaban . Blood pressure control with valsartan , readings sometimes reaching 160/90 mmHg. No recent symptoms of leg swelling or dyspnea. Emphasized medication adherence and symptom monitoring. - Continue metoprolol 50 mg twice daily - Continue rivaroxaban   Tricuspid Regurgitation Severe tricuspid regurgitation noted on echocardiogram. Asymptomatic with no signs of right heart failure. Discussed potential future interventions, including minimally invasive procedures, but opted for monitoring. Emphasized recognizing heart failure symptoms. - Repeat echocardiogram in one year - Educate on heart failure symptoms (leg swelling, dyspnea, abdominal distension) - Instruct to call if symptoms develop -In spite of severe TR, no clinical evidence  of heart failure.  Advised her to restart spironolactone  which should also help with chronic diastolic heart failure as well.  No RV dilatation, RV systolic function is preserved.  Hypertension Hypertension  managed with valsartan . Blood pressure readings vary but generally well-controlled. Spironolactone  recommended to manage blood pressure and prevent fluid buildup. Discussed benefits of spironolactone  in preventing heart failure and importance of daily use. - Continue valsartan  - Resume spironolactone  daily - Monitor blood pressure regularly - Repeat blood work to monitor kidney function and potassium levels  Follow-up - Schedule echocardiogram in one year - Follow-up visit after echocardiogram - Contact doctor if heart failure symptoms develop.  Symptoms of heart failure including dyspnea, PND and orthopnea, abdominal distention and leg edema, discussed with the patient in detail.   Signed,  Gordy Bergamo, MD, [facility] 07/17/2023, 9:40 PM [facility] [address] [place], [place] [address] Phone: [phone]. Fax:  [phone]

## 2023-07-17 NOTE — Patient Instructions (Signed)
 Medication Instructions:  Your physician recommends that you continue on your current medications as directed. Please refer to the Current Medication list given to you today.  *If you need a refill on your cardiac medications before your next appointment, please call your pharmacy*   Lab Work: none If you have labs (blood work) drawn today and your tests are completely normal, you will receive your results only by: MyChart Message (if you have MyChart) OR A paper copy in the mail If you have any lab test that is abnormal or we need to change your treatment, we will call you to review the results.   Testing/Procedures: Your physician has requested that you have an echocardiogram in one year. Echocardiography is a painless test that uses sound waves to create images of your heart. It provides your doctor with information about the size and shape of your heart and how well your heart's chambers and valves are working. This procedure takes approximately one hour. There are no restrictions for this procedure. Please do NOT wear cologne, perfume, aftershave, or lotions (deodorant is allowed). Please arrive 15 minutes prior to your appointment time.  Please note: We ask at that you not bring children with you during ultrasound (echo/ vascular) testing. Due to room size and safety concerns, children are not allowed in the ultrasound rooms during exams. Our front office staff cannot provide observation of children in our lobby area while testing is being conducted. An adult accompanying a patient to their appointment will only be allowed in the ultrasound room at the discretion of the ultrasound technician under special circumstances. We apologize for any inconvenience.    Follow-Up: At [facility], you and your health needs are our priority.  As part of our continuing mission to provide you with exceptional heart care, we have created designated Provider Care Teams.  These Care Teams  include your primary Cardiologist (physician) and Advanced Practice Providers (APPs -  Physician Assistants and Nurse Practitioners) who all work together to provide you with the care you need, when you need it.  We recommend signing up for the patient portal called "MyChart".  Sign up information is provided on this After Visit Summary.  MyChart is used to connect with patients for Virtual Visits (Telemedicine).  Patients are able to view lab/test results, encounter notes, upcoming appointments, etc.  Non-urgent messages can be sent to your provider as well.   To learn more about what you can do with MyChart, go to [email or web address].    Your next appointment:   12 month(s)  Provider:   [name], MD     Other Instructions

## 2023-07-29 DIAGNOSIS — N764 Abscess of vulva: Secondary | ICD-10-CM | POA: Diagnosis not present

## 2023-08-02 DIAGNOSIS — I272 Pulmonary hypertension, unspecified: Secondary | ICD-10-CM | POA: Diagnosis not present

## 2023-08-02 DIAGNOSIS — Z299 Encounter for prophylactic measures, unspecified: Secondary | ICD-10-CM | POA: Diagnosis not present

## 2023-08-02 DIAGNOSIS — R21 Rash and other nonspecific skin eruption: Secondary | ICD-10-CM | POA: Diagnosis not present

## 2023-08-02 DIAGNOSIS — I10 Essential (primary) hypertension: Secondary | ICD-10-CM | POA: Diagnosis not present

## 2023-08-02 DIAGNOSIS — D6869 Other thrombophilia: Secondary | ICD-10-CM | POA: Diagnosis not present

## 2023-08-02 DIAGNOSIS — I4891 Unspecified atrial fibrillation: Secondary | ICD-10-CM | POA: Diagnosis not present

## 2023-08-15 DIAGNOSIS — N907 Vulvar cyst: Secondary | ICD-10-CM | POA: Diagnosis not present

## 2023-08-19 DIAGNOSIS — N907 Vulvar cyst: Secondary | ICD-10-CM | POA: Diagnosis not present

## 2023-08-19 DIAGNOSIS — L905 Scar conditions and fibrosis of skin: Secondary | ICD-10-CM | POA: Diagnosis not present

## 2023-08-27 DIAGNOSIS — H33311 Horseshoe tear of retina without detachment, right eye: Secondary | ICD-10-CM | POA: Diagnosis not present

## 2023-08-27 DIAGNOSIS — H43811 Vitreous degeneration, right eye: Secondary | ICD-10-CM | POA: Diagnosis not present

## 2023-08-27 DIAGNOSIS — H2513 Age-related nuclear cataract, bilateral: Secondary | ICD-10-CM | POA: Diagnosis not present

## 2023-09-02 DIAGNOSIS — E039 Hypothyroidism, unspecified: Secondary | ICD-10-CM | POA: Diagnosis not present

## 2023-09-03 DIAGNOSIS — Z299 Encounter for prophylactic measures, unspecified: Secondary | ICD-10-CM | POA: Diagnosis not present

## 2023-09-03 DIAGNOSIS — R21 Rash and other nonspecific skin eruption: Secondary | ICD-10-CM | POA: Diagnosis not present

## 2023-09-03 DIAGNOSIS — Z7189 Other specified counseling: Secondary | ICD-10-CM | POA: Diagnosis not present

## 2023-09-03 DIAGNOSIS — Z0000 Encounter for general adult medical examination without abnormal findings: Secondary | ICD-10-CM | POA: Diagnosis not present

## 2023-09-03 DIAGNOSIS — Z1339 Encounter for screening examination for other mental health and behavioral disorders: Secondary | ICD-10-CM | POA: Diagnosis not present

## 2023-09-03 DIAGNOSIS — I10 Essential (primary) hypertension: Secondary | ICD-10-CM | POA: Diagnosis not present

## 2023-09-03 DIAGNOSIS — R0789 Other chest pain: Secondary | ICD-10-CM | POA: Diagnosis not present

## 2023-09-03 DIAGNOSIS — Z1331 Encounter for screening for depression: Secondary | ICD-10-CM | POA: Diagnosis not present

## 2023-09-05 DIAGNOSIS — B0229 Other postherpetic nervous system involvement: Secondary | ICD-10-CM | POA: Diagnosis not present

## 2023-09-05 DIAGNOSIS — Z299 Encounter for prophylactic measures, unspecified: Secondary | ICD-10-CM | POA: Diagnosis not present

## 2023-09-05 DIAGNOSIS — I10 Essential (primary) hypertension: Secondary | ICD-10-CM | POA: Diagnosis not present

## 2023-09-17 DIAGNOSIS — N907 Vulvar cyst: Secondary | ICD-10-CM | POA: Diagnosis not present

## 2023-09-17 DIAGNOSIS — I10 Essential (primary) hypertension: Secondary | ICD-10-CM | POA: Diagnosis not present

## 2023-12-09 ENCOUNTER — Other Ambulatory Visit: Payer: Self-pay | Admitting: Cardiology

## 2023-12-09 DIAGNOSIS — I1A0 Resistant hypertension: Secondary | ICD-10-CM

## 2024-01-07 DIAGNOSIS — H43813 Vitreous degeneration, bilateral: Secondary | ICD-10-CM | POA: Diagnosis not present

## 2024-01-07 DIAGNOSIS — H33311 Horseshoe tear of retina without detachment, right eye: Secondary | ICD-10-CM | POA: Diagnosis not present

## 2024-01-07 DIAGNOSIS — H2513 Age-related nuclear cataract, bilateral: Secondary | ICD-10-CM | POA: Diagnosis not present

## 2024-01-21 DIAGNOSIS — D510 Vitamin B12 deficiency anemia due to intrinsic factor deficiency: Secondary | ICD-10-CM | POA: Diagnosis not present

## 2024-01-21 DIAGNOSIS — I4891 Unspecified atrial fibrillation: Secondary | ICD-10-CM | POA: Diagnosis not present

## 2024-01-21 DIAGNOSIS — B2790 Infectious mononucleosis, unspecified without complication: Secondary | ICD-10-CM | POA: Diagnosis not present

## 2024-01-21 DIAGNOSIS — E063 Autoimmune thyroiditis: Secondary | ICD-10-CM | POA: Diagnosis not present

## 2024-01-21 DIAGNOSIS — R5382 Chronic fatigue, unspecified: Secondary | ICD-10-CM | POA: Diagnosis not present

## 2024-01-21 DIAGNOSIS — Z0000 Encounter for general adult medical examination without abnormal findings: Secondary | ICD-10-CM | POA: Diagnosis not present

## 2024-01-21 DIAGNOSIS — R799 Abnormal finding of blood chemistry, unspecified: Secondary | ICD-10-CM | POA: Diagnosis not present

## 2024-01-21 DIAGNOSIS — Z780 Asymptomatic menopausal state: Secondary | ICD-10-CM | POA: Diagnosis not present

## 2024-01-21 DIAGNOSIS — R7303 Prediabetes: Secondary | ICD-10-CM | POA: Diagnosis not present

## 2024-02-18 DIAGNOSIS — Z0000 Encounter for general adult medical examination without abnormal findings: Secondary | ICD-10-CM | POA: Diagnosis not present

## 2024-02-18 DIAGNOSIS — I4891 Unspecified atrial fibrillation: Secondary | ICD-10-CM | POA: Diagnosis not present

## 2024-02-18 DIAGNOSIS — I10 Essential (primary) hypertension: Secondary | ICD-10-CM | POA: Diagnosis not present

## 2024-02-18 DIAGNOSIS — Z299 Encounter for prophylactic measures, unspecified: Secondary | ICD-10-CM | POA: Diagnosis not present

## 2024-02-24 DIAGNOSIS — W57XXXA Bitten or stung by nonvenomous insect and other nonvenomous arthropods, initial encounter: Secondary | ICD-10-CM | POA: Diagnosis not present

## 2024-02-24 DIAGNOSIS — Z780 Asymptomatic menopausal state: Secondary | ICD-10-CM | POA: Diagnosis not present

## 2024-02-24 DIAGNOSIS — D510 Vitamin B12 deficiency anemia due to intrinsic factor deficiency: Secondary | ICD-10-CM | POA: Diagnosis not present

## 2024-02-24 DIAGNOSIS — R5383 Other fatigue: Secondary | ICD-10-CM | POA: Diagnosis not present

## 2024-02-24 DIAGNOSIS — L659 Nonscarring hair loss, unspecified: Secondary | ICD-10-CM | POA: Diagnosis not present

## 2024-02-24 DIAGNOSIS — R7303 Prediabetes: Secondary | ICD-10-CM | POA: Diagnosis not present

## 2024-02-24 DIAGNOSIS — M2550 Pain in unspecified joint: Secondary | ICD-10-CM | POA: Diagnosis not present

## 2024-02-24 DIAGNOSIS — E063 Autoimmune thyroiditis: Secondary | ICD-10-CM | POA: Diagnosis not present

## 2024-02-27 DIAGNOSIS — D2262 Melanocytic nevi of left upper limb, including shoulder: Secondary | ICD-10-CM | POA: Diagnosis not present

## 2024-02-27 DIAGNOSIS — D2261 Melanocytic nevi of right upper limb, including shoulder: Secondary | ICD-10-CM | POA: Diagnosis not present

## 2024-02-27 DIAGNOSIS — D2272 Melanocytic nevi of left lower limb, including hip: Secondary | ICD-10-CM | POA: Diagnosis not present

## 2024-02-27 DIAGNOSIS — Z85828 Personal history of other malignant neoplasm of skin: Secondary | ICD-10-CM | POA: Diagnosis not present

## 2024-02-27 DIAGNOSIS — L821 Other seborrheic keratosis: Secondary | ICD-10-CM | POA: Diagnosis not present

## 2024-02-27 DIAGNOSIS — Z08 Encounter for follow-up examination after completed treatment for malignant neoplasm: Secondary | ICD-10-CM | POA: Diagnosis not present

## 2024-02-27 DIAGNOSIS — D2271 Melanocytic nevi of right lower limb, including hip: Secondary | ICD-10-CM | POA: Diagnosis not present

## 2024-02-27 DIAGNOSIS — D225 Melanocytic nevi of trunk: Secondary | ICD-10-CM | POA: Diagnosis not present

## 2024-05-12 DIAGNOSIS — Z1212 Encounter for screening for malignant neoplasm of rectum: Secondary | ICD-10-CM | POA: Diagnosis not present

## 2024-05-12 DIAGNOSIS — Z1211 Encounter for screening for malignant neoplasm of colon: Secondary | ICD-10-CM | POA: Diagnosis not present

## 2024-05-20 LAB — COLOGUARD: COLOGUARD: NEGATIVE

## 2024-07-14 ENCOUNTER — Ambulatory Visit (HOSPITAL_COMMUNITY)
Admission: RE | Admit: 2024-07-14 | Discharge: 2024-07-14 | Disposition: A | Source: Ambulatory Visit | Attending: Cardiology | Admitting: Cardiology

## 2024-07-14 DIAGNOSIS — I10 Essential (primary) hypertension: Secondary | ICD-10-CM

## 2024-07-14 DIAGNOSIS — I5032 Chronic diastolic (congestive) heart failure: Secondary | ICD-10-CM

## 2024-07-14 DIAGNOSIS — I340 Nonrheumatic mitral (valve) insufficiency: Secondary | ICD-10-CM

## 2024-07-14 DIAGNOSIS — I4821 Permanent atrial fibrillation: Secondary | ICD-10-CM

## 2024-07-14 DIAGNOSIS — I071 Rheumatic tricuspid insufficiency: Secondary | ICD-10-CM

## 2024-07-14 DIAGNOSIS — I4891 Unspecified atrial fibrillation: Secondary | ICD-10-CM

## 2024-07-14 LAB — ECHOCARDIOGRAM COMPLETE: S' Lateral: 2.6 cm

## 2024-07-15 ENCOUNTER — Ambulatory Visit: Payer: Self-pay | Admitting: Cardiology

## 2024-07-20 ENCOUNTER — Ambulatory Visit: Admitting: Cardiology
